# Patient Record
Sex: Female | Born: 1965 | Race: White | Hispanic: No | Marital: Married | Smoking: Current every day smoker
Health system: Southern US, Community
[De-identification: ages and names within clinical notes are randomized; demographics above are authoritative.]

## PROBLEM LIST (undated history)

## (undated) DIAGNOSIS — C801 Malignant (primary) neoplasm, unspecified: Secondary | ICD-10-CM

## (undated) DIAGNOSIS — F419 Anxiety disorder, unspecified: Secondary | ICD-10-CM

## (undated) DIAGNOSIS — E785 Hyperlipidemia, unspecified: Secondary | ICD-10-CM

## (undated) DIAGNOSIS — F329 Major depressive disorder, single episode, unspecified: Secondary | ICD-10-CM

## (undated) DIAGNOSIS — M1990 Unspecified osteoarthritis, unspecified site: Secondary | ICD-10-CM

## (undated) DIAGNOSIS — F32A Depression, unspecified: Secondary | ICD-10-CM

## (undated) DIAGNOSIS — I319 Disease of pericardium, unspecified: Secondary | ICD-10-CM

## (undated) DIAGNOSIS — B1920 Unspecified viral hepatitis C without hepatic coma: Secondary | ICD-10-CM

## (undated) HISTORY — PX: TUBAL LIGATION: SHX77

## (undated) HISTORY — PX: PANCREATECTOMY: SHX1019

## (undated) HISTORY — DX: Malignant (primary) neoplasm, unspecified: C80.1

## (undated) HISTORY — PX: TONSILLECTOMY: SUR1361

## (undated) HISTORY — PX: HEMORRHOID SURGERY: SHX153

## (undated) HISTORY — DX: Hyperlipidemia, unspecified: E78.5

## (undated) HISTORY — PX: SPLENECTOMY, TOTAL: SHX788

## (undated) HISTORY — PX: KNEE ARTHROSCOPY: SUR90

## (undated) HISTORY — DX: Unspecified viral hepatitis C without hepatic coma: B19.20

## (undated) HISTORY — PX: EYE SURGERY: SHX253

---

## 2000-07-17 ENCOUNTER — Encounter (INDEPENDENT_AMBULATORY_CARE_PROVIDER_SITE_OTHER): Payer: Self-pay | Admitting: *Deleted

## 2000-07-17 ENCOUNTER — Encounter: Payer: Self-pay | Admitting: Gastroenterology

## 2000-07-17 ENCOUNTER — Ambulatory Visit (HOSPITAL_COMMUNITY): Admission: RE | Admit: 2000-07-17 | Discharge: 2000-07-17 | Payer: Self-pay | Admitting: Gastroenterology

## 2002-06-23 ENCOUNTER — Ambulatory Visit (HOSPITAL_COMMUNITY): Admission: RE | Admit: 2002-06-23 | Discharge: 2002-06-23 | Payer: Self-pay | Admitting: General Surgery

## 2002-06-30 ENCOUNTER — Ambulatory Visit (HOSPITAL_COMMUNITY): Admission: RE | Admit: 2002-06-30 | Discharge: 2002-06-30 | Payer: Self-pay

## 2003-12-24 DIAGNOSIS — C801 Malignant (primary) neoplasm, unspecified: Secondary | ICD-10-CM

## 2003-12-24 HISTORY — DX: Malignant (primary) neoplasm, unspecified: C80.1

## 2004-03-27 ENCOUNTER — Other Ambulatory Visit: Admission: RE | Admit: 2004-03-27 | Discharge: 2004-03-27 | Payer: Self-pay | Admitting: Family Medicine

## 2004-04-03 ENCOUNTER — Ambulatory Visit (HOSPITAL_COMMUNITY): Admission: RE | Admit: 2004-04-03 | Discharge: 2004-04-03 | Payer: Self-pay | Admitting: Family Medicine

## 2004-04-09 ENCOUNTER — Ambulatory Visit (HOSPITAL_COMMUNITY): Admission: RE | Admit: 2004-04-09 | Discharge: 2004-04-09 | Payer: Self-pay | Admitting: Family Medicine

## 2004-04-20 ENCOUNTER — Encounter: Admission: RE | Admit: 2004-04-20 | Discharge: 2004-04-20 | Payer: Self-pay | Admitting: Vascular Surgery

## 2004-05-29 ENCOUNTER — Encounter (INDEPENDENT_AMBULATORY_CARE_PROVIDER_SITE_OTHER): Payer: Self-pay | Admitting: Specialist

## 2004-05-29 ENCOUNTER — Inpatient Hospital Stay (HOSPITAL_COMMUNITY): Admission: RE | Admit: 2004-05-29 | Discharge: 2004-06-05 | Payer: Self-pay | Admitting: Surgery

## 2004-07-09 ENCOUNTER — Encounter (HOSPITAL_COMMUNITY): Admission: RE | Admit: 2004-07-09 | Discharge: 2004-09-18 | Payer: Self-pay | Admitting: Oncology

## 2005-01-14 ENCOUNTER — Ambulatory Visit (HOSPITAL_COMMUNITY): Admission: RE | Admit: 2005-01-14 | Discharge: 2005-01-14 | Payer: Self-pay | Admitting: Oncology

## 2005-01-22 ENCOUNTER — Ambulatory Visit: Payer: Self-pay | Admitting: Oncology

## 2005-04-29 ENCOUNTER — Ambulatory Visit: Payer: Self-pay | Admitting: Oncology

## 2005-09-24 ENCOUNTER — Ambulatory Visit: Payer: Self-pay | Admitting: Oncology

## 2005-12-24 ENCOUNTER — Encounter: Admission: RE | Admit: 2005-12-24 | Discharge: 2005-12-24 | Payer: Self-pay | Admitting: Oncology

## 2005-12-24 ENCOUNTER — Ambulatory Visit: Payer: Self-pay | Admitting: Oncology

## 2006-06-09 ENCOUNTER — Encounter: Admission: RE | Admit: 2006-06-09 | Discharge: 2006-06-09 | Payer: Self-pay | Admitting: Oncology

## 2006-06-17 ENCOUNTER — Ambulatory Visit: Payer: Self-pay | Admitting: Oncology

## 2006-06-23 LAB — CBC WITH DIFFERENTIAL/PLATELET
Basophils Absolute: 0 10*3/uL (ref 0.0–0.1)
EOS%: 1 % (ref 0.0–7.0)
HCT: 40.5 % (ref 34.8–46.6)
HGB: 13.8 g/dL (ref 11.6–15.9)
MCH: 31.4 pg (ref 26.0–34.0)
MCV: 92.3 fL (ref 81.0–101.0)
MONO%: 16.4 % — ABNORMAL HIGH (ref 0.0–13.0)
NEUT%: 48.4 % (ref 39.6–76.8)

## 2006-06-26 LAB — COMPREHENSIVE METABOLIC PANEL
AST: 58 U/L — ABNORMAL HIGH (ref 0–37)
Alkaline Phosphatase: 67 U/L (ref 39–117)
BUN: 11 mg/dL (ref 6–23)
Calcium: 9.1 mg/dL (ref 8.4–10.5)
Chloride: 107 mEq/L (ref 96–112)
Creatinine, Ser: 0.69 mg/dL (ref 0.40–1.20)
Glucose, Bld: 100 mg/dL — ABNORMAL HIGH (ref 70–99)

## 2006-06-30 LAB — URINALYSIS, MICROSCOPIC - CHCC
Bilirubin (Urine): NEGATIVE
Glucose: NEGATIVE g/dL
Ketones: NEGATIVE mg/dL
RBC count: NEGATIVE (ref 0–2)
pH: 6 (ref 4.6–8.0)

## 2006-07-02 LAB — URINE CULTURE

## 2006-08-27 ENCOUNTER — Encounter: Admission: RE | Admit: 2006-08-27 | Discharge: 2006-08-27 | Payer: Self-pay | Admitting: Obstetrics and Gynecology

## 2006-12-20 ENCOUNTER — Ambulatory Visit: Payer: Self-pay | Admitting: Oncology

## 2008-05-23 ENCOUNTER — Ambulatory Visit: Payer: Self-pay | Admitting: Oncology

## 2008-05-25 LAB — CBC WITH DIFFERENTIAL/PLATELET
Basophils Absolute: 0.1 10*3/uL (ref 0.0–0.1)
Eosinophils Absolute: 0.2 10*3/uL (ref 0.0–0.5)
HCT: 41.3 % (ref 34.8–46.6)
HGB: 13.9 g/dL (ref 11.6–15.9)
MONO#: 1.4 10*3/uL — ABNORMAL HIGH (ref 0.1–0.9)
NEUT%: 54.1 % (ref 39.6–76.8)
WBC: 9.9 10*3/uL (ref 3.9–10.0)
lymph#: 2.9 10*3/uL (ref 0.9–3.3)

## 2008-05-27 LAB — COMPREHENSIVE METABOLIC PANEL
ALT: 61 U/L — ABNORMAL HIGH (ref 0–35)
BUN: 16 mg/dL (ref 6–23)
CO2: 26 mEq/L (ref 19–32)
Calcium: 9.8 mg/dL (ref 8.4–10.5)
Chloride: 106 mEq/L (ref 96–112)
Creatinine, Ser: 0.64 mg/dL (ref 0.40–1.20)

## 2008-05-27 LAB — CHROMOGRANIN A: Chromogranin A: 7.4 ng/mL (ref <=36.4)

## 2008-06-08 ENCOUNTER — Ambulatory Visit (HOSPITAL_COMMUNITY): Admission: RE | Admit: 2008-06-08 | Discharge: 2008-06-08 | Payer: Self-pay | Admitting: Oncology

## 2008-06-13 ENCOUNTER — Encounter: Admission: RE | Admit: 2008-06-13 | Discharge: 2008-06-13 | Payer: Self-pay | Admitting: Oncology

## 2008-09-21 ENCOUNTER — Encounter: Admission: RE | Admit: 2008-09-21 | Discharge: 2008-09-21 | Payer: Self-pay | Admitting: Obstetrics and Gynecology

## 2009-03-21 ENCOUNTER — Emergency Department (HOSPITAL_COMMUNITY): Admission: EM | Admit: 2009-03-21 | Discharge: 2009-03-21 | Payer: Self-pay | Admitting: Emergency Medicine

## 2009-03-23 ENCOUNTER — Encounter (INDEPENDENT_AMBULATORY_CARE_PROVIDER_SITE_OTHER): Payer: Self-pay | Admitting: General Surgery

## 2009-03-23 ENCOUNTER — Ambulatory Visit (HOSPITAL_COMMUNITY): Admission: RE | Admit: 2009-03-23 | Discharge: 2009-03-23 | Payer: Self-pay | Admitting: General Surgery

## 2009-05-31 ENCOUNTER — Ambulatory Visit: Payer: Self-pay | Admitting: Oncology

## 2009-05-31 LAB — CBC WITH DIFFERENTIAL/PLATELET
EOS%: 1.6 % (ref 0.0–7.0)
Eosinophils Absolute: 0.1 10*3/uL (ref 0.0–0.5)
LYMPH%: 36.4 % (ref 14.0–49.7)
MCH: 30.8 pg (ref 25.1–34.0)
MCHC: 34.2 g/dL (ref 31.5–36.0)
MCV: 90.2 fL (ref 79.5–101.0)
MONO%: 15.5 % — ABNORMAL HIGH (ref 0.0–14.0)
NEUT#: 3.9 10*3/uL (ref 1.5–6.5)
Platelets: 419 10*3/uL — ABNORMAL HIGH (ref 145–400)
RBC: 3.57 10*6/uL — ABNORMAL LOW (ref 3.70–5.45)
nRBC: 0 % (ref 0–0)

## 2009-05-31 LAB — COMPREHENSIVE METABOLIC PANEL
ALT: 43 U/L — ABNORMAL HIGH (ref 0–35)
CO2: 30 mEq/L (ref 19–32)
Creatinine, Ser: 0.69 mg/dL (ref 0.40–1.20)
Glucose, Bld: 87 mg/dL (ref 70–99)
Total Bilirubin: 0.4 mg/dL (ref 0.3–1.2)

## 2009-05-31 LAB — TECHNOLOGIST REVIEW

## 2009-06-01 ENCOUNTER — Ambulatory Visit (HOSPITAL_COMMUNITY): Admission: RE | Admit: 2009-06-01 | Discharge: 2009-06-01 | Payer: Self-pay | Admitting: Oncology

## 2009-06-04 LAB — CHROMOGRANIN A: Chromogranin A: 7.3 ng/mL (ref <=36.4)

## 2009-07-03 ENCOUNTER — Ambulatory Visit: Payer: Self-pay | Admitting: Oncology

## 2011-01-12 ENCOUNTER — Encounter: Payer: Self-pay | Admitting: Oncology

## 2011-04-03 LAB — URINALYSIS, ROUTINE W REFLEX MICROSCOPIC
Glucose, UA: NEGATIVE mg/dL
Hgb urine dipstick: NEGATIVE
Ketones, ur: 15 mg/dL — AB
Protein, ur: NEGATIVE mg/dL
pH: 6.5 (ref 5.0–8.0)

## 2011-04-03 LAB — COMPREHENSIVE METABOLIC PANEL
ALT: 41 U/L — ABNORMAL HIGH (ref 0–35)
AST: 43 U/L — ABNORMAL HIGH (ref 0–37)
Albumin: 3.8 g/dL (ref 3.5–5.2)
Alkaline Phosphatase: 65 U/L (ref 39–117)
Calcium: 9.1 mg/dL (ref 8.4–10.5)
GFR calc Af Amer: >60 mL/min (ref >60)
Potassium: 3.6 mEq/L (ref 3.5–5.1)
Sodium: 138 mEq/L (ref 135–145)
Total Protein: 6.6 g/dL (ref 6.0–8.3)

## 2011-04-03 LAB — URINE MICROSCOPIC-ADD ON

## 2011-04-03 LAB — DIFFERENTIAL
Eosinophils Absolute: 0.1 10*3/uL (ref 0.0–0.7)
Eosinophils Relative: 1 % (ref 0–5)
Lymphs Abs: 1.8 10*3/uL (ref 0.7–4.0)
Monocytes Absolute: 1.7 10*3/uL — ABNORMAL HIGH (ref 0.1–1.0)
Monocytes Relative: 15 % — ABNORMAL HIGH (ref 3–12)

## 2011-04-03 LAB — CBC
MCHC: 33.7 g/dL (ref 30.0–36.0)
RBC: 4.75 MIL/uL (ref 3.87–5.11)
RDW: 14.4 % (ref 11.5–15.5)

## 2011-05-07 NOTE — Op Note (Signed)
Kristine Madden, Kristine Madden              ACCOUNT NO.:  [id number]   MEDICAL RECORD NO.:  [id number]          PATIENT TYPE:  AMB   LOCATION:  DAY                          FACILITY:  [facility]   PHYSICIAN:  Sharlet Salina T. Hoxworth, M.D.DATE OF BIRTH:  09-14-1966   DATE OF PROCEDURE:  03/23/2009  DATE OF DISCHARGE:                               OPERATIVE REPORT   PREOPERATIVE DIAGNOSIS:  Severe rectal pain.   POSTOPERATIVE DIAGNOSIS:  Severe combination hemorrhoids with  thrombosis.   SURGICAL PROCEDURE:  Exam under anesthesia, extensive hemorrhoidectomy,  lateral internal anal sphincterotomy.   SURGEON:  Lorne Skeens. Hoxworth, M.D.   ANESTHESIA:  General.   BRIEF HISTORY:  Kristine Madden is a 45 year old female with a previous  history of hemorrhoidectomy by Dr. Carolynne Edouard in 2003 for internal and  external hemorrhoids with swelling and irritation.  She states she had  continued to have some problems over the years with intermittent  swelling and prolapse and mild pain.  However, in recent days,  particularly the past 2-3 days she has had persistent severe rectal pain  which required a trip to the emergency room.  She was treated with  topical cream.  I saw her in the office yesterday.  She was continuing  to complain of very severe pain.  Exam in the office was difficult.  She  did appear to have some hemorrhoids but due to pain and spasm exam in  the office was difficult.  She also appeared to have some spasm of the  internal anal sphincter.  I recommended exam under anesthesia and then  proceeding based on the findings with possible sphincterotomy and/or  hemorrhoidectomy.  The indications for the procedure, possible  procedures to be performed, risks of bleeding, infection, degrees of  incontinence were discussed and understood.  She is now brought to the  operating room for this procedure.   DESCRIPTION OF OPERATION:  The patient was brought to the operating room  and placed in supine  position on the operating table and general  endotracheal anesthesia was induced.  She was carefully placed in the  lithotomy position.  The patient had told me in the holding area that  she had had significant tissue prolapse overnight.  Indeed on external  inspection there were significantly prolapsed combination hemorrhoids  with thrombosis.  The perineum was widely sterilely prepped and draped.  She received preoperative IV antibiotics.  Correct patient and procedure  were verified.  The anus was gently dilated and rectal retractor placed.  There were significantly thrombosed irritated combination hemorrhoids in  the right posterior lateral and left posterior lateral position.  The  internal anal sphincter did feel taut and hypertrophied but I did not  see any definite fissure.  There was no evidence of abscess, infection  or fistula or mucosal inflammation.  I elected to proceed with formal  hemorrhoidectomy.  The larger right posterior lateral group was  addressed initially.  This was elevated with a Pennington clamp and a 3-  0 chromic suture was placed at the apex of the hemorrhoidal group.  An  elliptical excision was then performed  extending out onto the anoderm.  The hemorrhoidal group was then dissected up off the anal sphincters  carefully protecting them.  There was extensive thrombosis within the  hemorrhoid.  This large hemorrhoid group was completely excised.  Hemostasis was obtained with cautery and with closure of the defect with  the chromic suture in a running locking fashion.  Following this the  somewhat smaller but significant area in the left lateral position was  identically excised and closed.  The patient did seem to have  significant hypertrophy of the internal anal sphincter and I thought  that pain and healing would be improved with sphincterotomy.  I  identified the intersphincteric groove in the right lateral position.  A  small stab incision was made and  the hemostat inserted into the  intersphincteric groove and the internal sphincter hypertrophied portion  elevated into the small incision.  This was then divided with cautery  and hemostasis was obtained with pressure.  Following this an extensive  rectal block was performed with Marcaine.  A Gelfoam pack was placed.  Hemostasis appeared complete.  Sponge, needle and instrument counts were  correct.  The patient was taken to recovery in good condition.      Lorne Skeens. Hoxworth, M.D.  Electronically Signed     BTH/MEDQ  D:  03/23/2009  T:  03/23/2009  Job:  [id number]

## 2011-05-10 NOTE — Discharge Summary (Signed)
NAME:  Kristine Madden, Kristine Madden                        ACCOUNT NO.:  [id number]   MEDICAL RECORD NO.:  [id number]                   PATIENT TYPE:  INP   LOCATION:  0480                                 FACILITY:  [facility]   PHYSICIAN:  Velora Heckler, M.D.                DATE OF BIRTH:  14-Oct-1966   DATE OF ADMISSION:  05/29/2004  DATE OF DISCHARGE:  06/05/2004                                 DISCHARGE SUMMARY   REASON FOR ADMISSION:  Cystic neoplasm of the pancreas.   HISTORY OF PRESENT ILLNESS:  Patient is a 45 year old white female from  [place], [place].  Patient had undergone a CT scan of the abdomen and  pelvis for abdominal pain.  She was noted to have a 3.7 cm cystic neoplasm  of the tail of the pancreas.  Further workup including a MRI and CT  angiography.  She was referred to our practice for consideration of  resection.   HOSPITAL COURSE:  The patient was admitted on May 29, 2004.  She underwent  distal pancreatectomy and splenectomy for resection of a neoplasm of the  tail of the pancreas.  Postoperative course was relatively straightforward.  Patient remained hemodynamically stable.  She had a postoperative ileus,  which persisted for four days.  Nasogastric tube was removed on the fourth  postoperative day.  She was started on clear liquids on the fourth  postoperative day.  She was advanced in her diet.  She was switched to oral  pain medication.  Drainage from her Jackson-Pratt drain decreased  significantly over the 6th and 7th postoperative days, and it was removed  prior to discharge.   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  Surgical margins were  negative.  There was no evidence of spread to the lymph nodes or adjacent  viscera.   DISCHARGE PLAN:  Patient was discharged home on June 05, 2004.  Discharge  medications included Tylox as needed for pain.  Patient will be seen back  at  my office at [facility] in 10-14 days.   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                                               Velora Heckler, M.D.    TMG/MEDQ  D:  06/15/2004  T:  06/15/2004  Job:  [id number]   cc:   Janetta Hora. Fields, MD  [address], [place] [address]   Elana Alm. Nicholos Johns, M.D.  [address]  [place]  [place] [address]  Fax: [phone]   Tasia Catchings, M.D.  [address]  [address]  [place]  [place] [address]  Fax: [phone]

## 2011-05-10 NOTE — Op Note (Signed)
[facility]  Patient:    Kristine Madden, Kristine Madden Visit Number: [id number] MRN: [id number]          Service Type: DSU Location: DAY Attending Physician:  Barbaraann Cao Dictated by:   Ronda Fairly. Galen Daft, M.D. Proc. Date: 06/30/02 Admit Date:  06/30/2002 Discharge Date: 06/30/2002   CC:         Molly Maduro A. Eliezer Lofts., M.D.   Operative Report  PREOPERATIVE DIAGNOSES:  Elective sterilization, desires sterilization and multiparity.  POSTOPERATIVE DIAGNOSES:  Elective sterilization, desires sterilization and multiparity.  PROCEDURE:  Laparoscopic tubal ligation.  SURGEON:  Ronda Fairly. Galen Daft, M.D.  ANESTHESIA:  General.  COMPLICATIONS:  None.  ESTIMATED BLOOD LOSS:  Less than 10 cc.  DESCRIPTION OF PROCEDURE:  The patient was identified as Kristine Madden.  We obtained informed consent prior to bringing her to the operating room. After this, Betadine prep sterile technique was utilized. An umbilical incision was utilized for the Veress needle. The abdomen was inflated after care was taken to ascertain correct placement. The carbon dioxide was the medium of choice. The trocar was then placed in the umbilical site. The uterus was normal in appearance as were her left and right ovary. The tubes were normal identified down to their fimbriated ends on both sides separate and distinct from other structures. The left tube was grasped with the Klepinger bipolar cautery and it was cauterized in four locations contiguously down to zero amplitude. The right tube was done in the same fashion. Again the tubes were identified down to their fimbriated ends. There were no adjacent structures harmed in this fashion. The procedure was then completed and the instruments were removed and the gas was removed from the abdomen. The fascia was reinforced with the cutaneous suture which was monocryl. The cutaneous suture was also utilized for the subcuticular closure with two  interrupted sutures and there was no active bleeding. The incision was infiltrated with 5 cc of 0.5 strength of Marcaine and the instrument, sponge and needle counts were correct throughout the case. Dictated by:   Ronda Fairly. Galen Daft, M.D. Attending Physician:  Barbaraann Cao DD:  06/30/02 TD:  07/04/02 Job: [id number]

## 2011-05-10 NOTE — Op Note (Signed)
NAME:  Kristine Madden, Kristine Madden                        ACCOUNT NO.:  [id number]   MEDICAL RECORD NO.:  [id number]                   PATIENT TYPE:  INP   LOCATION:  Z610                                 FACILITY:  [facility]   PHYSICIAN:  Velora Heckler, M.D.                DATE OF BIRTH:  [birth year]   DATE OF PROCEDURE:  05/29/2004  DATE OF DISCHARGE:                                 OPERATIVE REPORT   PREOPERATIVE DIAGNOSIS:  Cystic neoplasm of the pancreas.   POSTOPERATIVE DIAGNOSIS:  Probably neuroendocrine tumor of the pancreas.   PROCEDURE:  1. Distal pancreatectomy.  2. Splenectomy.   SURGEON:  Velora Heckler, M.D.   ASSISTANT:  Jerelene Redden, M.D.   ANESTHESIA:  General per Dr. Sherrian Divers   ESTIMATED BLOOD LOSS:  150 mL.   PREPARATION:  Betadine.   COMPLICATIONS:  None.   DRAINS:  16 [place] Blake drain to pancreatic bed.   INDICATIONS:  The patient is a 45 year old white female from [place], [place].  The patient presented with abdominal pain.  She underwent CT scan  of the abdomen and pelvis in April 2005.  This showed a 3.7 cm cystic  neoplasm in the tail of the pancreas.  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  The patient was sent to Dr. Fabienne Bruns at [facility].  He  ordered a CT angiogram.  This showed a cystic neoplasm of the tail of the  pancreas and no evidence of splenic artery aneurysm.  The patient was then  referred to my practice for consideration for a resection.  Preoperative  assessment with tumor markers was negative.  The patient did receive triple  vaccinations in preparation for splenectomy.  The patient now comes to the  operating room for distal pancreatectomy and splenectomy for cystic neoplasm  of the tail of the pancreas.   DESCRIPTION OF PROCEDURE:  The procedure is done in OR #11 at  the [facility].  The patient is brought to the operating room,  placed in a supine position on the operating room table.  Following the  administration of general anesthesia, the patient is prepped and draped in  the usual strict aseptic fashion.  After ascertaining that an adequate level  of anesthesia had been obtained, a left subcostal incision is made with a  #10 blade.  Dissection is carried down through subcutaneous tissues with the  electrocautery.  Fascia is incised, then muscle wall is divided in layers,  and the peritoneal cavity is entered cautiously.  Abdomen is explored.  Bookwalter retractor is placed for exposure.  Splenic flexure of the colon  is taken down using the electrocautery.  It is gently packed away.  The  gastrocolic ligament is then divided  using the electrocautery.  Vessels are  divided between hemostats and ligated with 3-0 and 2-0 silk ties.  Colon is  then packed away under the retractor providing good exposure of the left  upper quadrant.  Next, dissection is continued along the greater curvature  of the stomach.  Short gastric vessels are divided between Kelly clamps and  ligated with 2-0 silk ties and large Ligaclips.  Dissection is carried  proximally to the superior pole of the spleen.  Spleen is then mobilized  from its lateral retroperitoneal attachments.  The spleen is completely  elevated out of the left upper quadrant retroperitoneum.  Superior pole is  mobilized.  Remaining short gastric vessels between the high fundus of the  stomach and superior pole of the spleen are divided between large Ligaclips.  The stomach is then packed medially along with the left lateral segment of  the liver.  With good exposure of the distal pancreas, there is an obvious  mass, measuring approximately 4 cm in greatest dimension in the mid tail of  the pancreas.  With gentle dissection the superior and inferior borders of  the pancreas are dissected  out.  Spleen is then rotated medially, exposing  the posterior aspect of the pancreas.  A pedicle of tail of the pancreas and  spleen is then completely elevated.  The splenic vessels are noted at the  superior edge of the pancreas.  The splenic artery is dissected out proximal  to the tumor.  It is doubly ligated proximally and distally with 2-0 silk  ties and divided.  Next, the splenic vein is dissected out, again proximal  to the tumor, doubly ligated proximally and distally, and divided.  Stay  sutures are placed at the superior and inferior edges of the normal pancreas  proximal to the tumor.  These are tied securely.  Using a TA stapler with  4.8 mm staples, the pancreas is transected proximal to the tumor.  The  specimen is submitted as distal pancreas and spleen en bloc.  Dr. Jimmy Picket performed a frozen section biopsy at the pancreatic margin.  On  close inspection, there does appear to be a small, less than 1/10th of a mm  nodule of tumor present at the surgical margin.  Therefore, the pancreas is  again inspected.  The splenic vessels are dissected down on the superior  margin of the body of the pancreas.  Stay sutures are again placed on the  superior and inferior edges of the body of the pancreas and tied securely.  A second reload of the TA 60 stapler with 4.8 mm staples is again placed  across the body of the pancreas so as to obtain approximately 10-15 mm more  pancreatic tissue.  Stapler is fired, and the distal pancreas is excised  sharply with a #10 blade.  Specimen is marked with a suture at the new  pancreatic margin.  It is submitted to pathology where Dr. Luisa Hart again  performed a frozen section.  He feels there is a generous pancreatic margin  around the tumor now present.  He also feels that the tumor likely  represents a neuroendocrine tumor of the pancreas.   Good hemostasis is noted after removal of the stapler.  A 2-0 silk figure-of- eight suture ligature  is placed near the splenic vessels at the superior  edge of the pancreatic parenchyma.  Good hemostasis is noted.  Surgicel is  placed over the pancreatic stump and along the staple line.  A 19 [place]  Blake drain is brought in from a left lateral stab wound through the  abdominal wall, and the drain is placed along the pancreatic bed near the  cut surface of the pancreas.  Abdomen is irrigated copiously with warm  saline which is evacuated.  Very good hemostasis is noted.  Stomach is laid  over the pancreas and drained.  Colon is released.  Liver is released.  Good  hemostasis is noted throughout.  Abdominal wall is then closed in two layers  with running #1 PDS sutures.  The skin is closed with stainless steel  staples.  Drain is secured to the skin with a 3-0 nylon suture and placed to  bulb suction.  Wounds are washed and dried, and sterile dressings are  applied.  The patient is awakened from anesthesia and brought to the  recovery room in stable condition.  The patient tolerated the procedure  well.                                               Velora Heckler, M.D.    TMG/MEDQ  D:  05/29/2004  T:  05/29/2004  Job:  [id number]   cc:   Janetta Hora. Fields, MD  [address], [place] [address]   Elana Alm. Nicholos Johns, M.D.  [address]  [place]  [place] [address]  Fax: [phone]   Tasia Catchings, M.D.  [address]  [place]  [place] [address]  Fax: [phone]

## 2011-05-10 NOTE — Op Note (Signed)
[facility]  Patient:    Kristine Madden, Kristine Madden Visit Number: [id number] MRN: [id number]          Service Type: DSU Location: DAY Attending Physician:  Barbaraann Cao Dictated by:   Ollen Gross. Vernell Morgans, M.D. Proc. Date: 06/23/02 Admit Date:  06/30/2002 Discharge Date: 06/30/2002                             Operative Report  PREOPERATIVE DIAGNOSIS:  Internal and external hemorrhoids.  POSTOPERATIVE DIAGNOSIS:  Internal and external hemorrhoids.  PROCEDURE:  Hemorrhoidectomy.  SURGEON:  Ollen Gross. Vernell Morgans, M.D.  ANESTHESIA:  General via LMA.  PROCEDURE:  After informed consent was obtained, the patient was brought to the operating room, placed in a supine position on the operating table. After adequate induction of general anesthesia, the patient was placed in lithotomy position and her rectal region was prepped with Betadine and draped in usual sterile manner. The hemorrhoid in question was infiltrated with a mixture of 9 cc of lidocaine and 1 cc of Wydase and this area was gently massaged for several minutes. The internal and external portions of the hemorrhoid were then grasped with Allis clamps. A figure-of-eight stitch was placed at the apex of the hemorrhoidal tissue interiorly. The hemorrhoidal tissue was then scored with a 15 blade knife. Sharp dissection was carried out externally to remove the hemorrhoid tissue from the external sphincter. The external and internal sphincters and the vascular supply to the hemorrhoid was then clamped with a hemostat and the rest of the hemorrhoid was removed sharply using a 2-0 chromic stitch which had previously been placed at the apex of the hemorrhoidal tissue. The stitch was then run exteriorly underneath the hemostat. The hemostat was then removed and the stitch was tightened down. The stitch was run exteriorly until there was about 3 or 4 mm of incision still open and at this point, the stitch was tied  down. A bullet retractor was able to be inserted into the anal canal easily. The incision was then inspected and appeared to be hemostatic and in good opposition. Lidocaine jelly was then placed in the anal canal and a piece of Gelfoam was placed over the incision. Sterile dressings were then applied. The patient tolerated the procedure well. At the end of the case, all sponge, instrument, and needle counts were correct. The patient was awakened, taken to the recovery room in stable condition. Dictated by:   Ollen Gross. Vernell Morgans, M.D. Attending Physician:  Barbaraann Cao DD:  07/14/02 TD:  07/19/02 Job: [id number]

## 2011-08-06 ENCOUNTER — Other Ambulatory Visit: Payer: Self-pay | Admitting: Obstetrics and Gynecology

## 2011-08-06 DIAGNOSIS — Z1231 Encounter for screening mammogram for malignant neoplasm of breast: Secondary | ICD-10-CM

## 2011-08-13 ENCOUNTER — Ambulatory Visit
Admission: RE | Admit: 2011-08-13 | Discharge: 2011-08-13 | Disposition: A | Discharge status: Home / Self Care | Payer: BC Managed Care – PPO | Source: Ambulatory Visit | Attending: Obstetrics and Gynecology | Admitting: Obstetrics and Gynecology

## 2011-08-13 DIAGNOSIS — Z1231 Encounter for screening mammogram for malignant neoplasm of breast: Secondary | ICD-10-CM

## 2013-08-17 ENCOUNTER — Ambulatory Visit (INDEPENDENT_AMBULATORY_CARE_PROVIDER_SITE_OTHER): Payer: Self-pay | Admitting: Surgery

## 2013-08-17 ENCOUNTER — Encounter (INDEPENDENT_AMBULATORY_CARE_PROVIDER_SITE_OTHER): Payer: Self-pay | Admitting: Surgery

## 2013-08-17 VITALS — BP 122/78 | HR 64 | Resp 16 | Ht 67.0 in | Wt 131.0 lb

## 2013-08-17 DIAGNOSIS — L0291 Cutaneous abscess, unspecified: Secondary | ICD-10-CM

## 2013-08-17 NOTE — Patient Instructions (Signed)
Remove packing in 48 hours.  Ok to shower in 24 hours. Finish antibiotics.  Return next week for recheck.

## 2013-08-17 NOTE — Addendum Note (Signed)
Addended by: Brennan Bailey on: 08/17/2013 04:15 PM   Modules accepted: Orders

## 2013-08-17 NOTE — Progress Notes (Signed)
Patient ID: Kristine Madden, female   DOB: May 18, 1966, 47 y.o.   MRN: [id number]  Chief Complaint  Patient presents with  . New Evaluation    urgent/absc on abdomen    HPI Kristine Madden is a 47 y.o. female.  Patient sent at request by Dr. Erling Conte do to skin abscess overlying the right pubis. Been present for last 4 days. Getting more red and tender. She was placed on doxycycline yesterday he received 1 g of Rocephin. She does shave this area. HPI  Past Medical History  Diagnosis Date  . Cancer     Past Surgical History  Procedure Laterality Date  . Hemorrhoid surgery      History reviewed. No pertinent family history.  Social History History  Substance Use Topics  . Smoking status: Current Every Day Smoker -- 1.00 packs/day    Types: Cigarettes  . Smokeless tobacco: Never Used  . Alcohol Use: Yes    Allergies  Allergen Reactions  . Penicillins Hives    Current Outpatient Prescriptions  Medication Sig Dispense Refill  . doxycycline (ORACEA) 40 MG capsule Take 40 mg by mouth every morning.      Marland Kitchen ibuprofen (ADVIL,MOTRIN) 800 MG tablet Take 800 mg by mouth every 8 (eight) hours as needed for pain.       No current facility-administered medications for this visit.    Review of Systems Review of Systems  Constitutional: Positive for fever, chills and fatigue.  Respiratory: Negative.   Cardiovascular: Negative.   Neurological: Negative.   Hematological: Negative.     Blood pressure 122/78, pulse 64, resp. rate 16, height 5\' 7"  (1.702 m), weight 131 lb (59.421 kg).  Physical Exam Physical Exam  Constitutional: She appears well-developed.  HENT:  Head: Normocephalic and atraumatic.  Skin:     Psychiatric: Her behavior is normal.    [facility]  Assessment    Right groin abscess    Plan    Recommend incision and drainage under local anesthesia in the office. Risks, benefits and alternative therapies discussed with the patient  husband. Risk of bleeding, infection, need for other operations. They wish to proceed. Under sterile conditions the skin overlying the right groin region over the right pubis was prepped in a sterile fashion. 1% lidocaine was used for local anesthesia. A cruciate incision was used and dark turbid fluid and a small pus was expressed. The erythema around this was noted. Packed with quarter-inch packing. Loculations broken up a Q-tip. Dry dressing applied. Remove packing in 24 hours and shower. Finish doxycycline. Return to office 1 week. Cover with clean dry dressing after packing is out. Call with high fever, nausea, vomiting or worsening pain.       Cornell Bourbon A. 08/17/2013, 3:58 PM

## 2013-08-19 ENCOUNTER — Other Ambulatory Visit (INDEPENDENT_AMBULATORY_CARE_PROVIDER_SITE_OTHER): Payer: Self-pay | Admitting: *Deleted

## 2013-08-19 ENCOUNTER — Telehealth (INDEPENDENT_AMBULATORY_CARE_PROVIDER_SITE_OTHER): Payer: Self-pay | Admitting: General Surgery

## 2013-08-19 MED ORDER — HYDROCODONE-ACETAMINOPHEN 5-325 MG PO TABS: 1.0000 | ORAL_TABLET | Freq: Four times a day (QID) | ORAL | Status: DC | PRN

## 2013-08-19 NOTE — Telephone Encounter (Signed)
Called in Norco 5/325mg  take 1 tablet every 6 hours as needed for pain #30 no refills; [phone]

## 2013-08-19 NOTE — Telephone Encounter (Signed)
yes

## 2013-08-19 NOTE — Telephone Encounter (Signed)
Husband called along with patient to state that when patient was here in the office she declined the narcotic pain medication however as they have moved further into the healing process and having to remove the packing patient now feels she needs a narcotic.  They are asking if Norco can be called into the [facility] in [place] to try to help the patient.  Explained that I need to get approval from Dr. Luisa Hart then will let them know.  He states understanding at this time and agreeable with plan.

## 2013-08-19 NOTE — Telephone Encounter (Signed)
Pt called to ask about removing the wound packing.  Suggested she get in the shower to moisten the packing material, since she stated is feels hard.  After the packing is wet, gently remove it completely; wash with soap and water, rinse thoroughly in shower, and pat dry.  OK to cover with DSD to prevent weeping on clothing.

## 2013-08-20 LAB — WOUND CULTURE

## 2013-08-25 ENCOUNTER — Encounter (INDEPENDENT_AMBULATORY_CARE_PROVIDER_SITE_OTHER): Payer: Self-pay | Admitting: Surgery

## 2013-08-25 ENCOUNTER — Ambulatory Visit (INDEPENDENT_AMBULATORY_CARE_PROVIDER_SITE_OTHER): Payer: Self-pay | Admitting: Surgery

## 2013-08-25 VITALS — BP 120/76 | HR 82 | Resp 16 | Ht 67.0 in | Wt 133.6 lb

## 2013-08-25 DIAGNOSIS — L0291 Cutaneous abscess, unspecified: Secondary | ICD-10-CM

## 2013-08-25 NOTE — Patient Instructions (Signed)
Return as needed

## 2013-08-25 NOTE — Progress Notes (Signed)
Patient returns after incision and drainage of abscess to right lower abdomen. Cultures verified this to be MRSA. He is sensitive to doxycycline. She is currently on it. She feels much better. It is much less tender. The redness is much less.  Exam: Wound to right lower abdomen just above right pubic bone is open and much less erythematous than last week. It is much less tender and the induration is minimal. It is not currently draining.  Impression: Status post incision and drainage of abscess right lower abdomen just above the pubic bone culture verified as MRSA on doxycycline  Plan: Finish antibiotics. Return if she develops more swelling, pain, or increased redness.

## 2016-07-04 ENCOUNTER — Emergency Department (HOSPITAL_COMMUNITY): Payer: BLUE CROSS/BLUE SHIELD

## 2016-07-04 ENCOUNTER — Emergency Department (HOSPITAL_COMMUNITY)
Admission: EM | Admit: 2016-07-04 | Discharge: 2016-07-04 | Disposition: A | Discharge status: Home / Self Care | Payer: BLUE CROSS/BLUE SHIELD | Attending: Emergency Medicine | Admitting: Emergency Medicine

## 2016-07-04 ENCOUNTER — Encounter (HOSPITAL_COMMUNITY): Payer: Self-pay | Admitting: Emergency Medicine

## 2016-07-04 DIAGNOSIS — R0781 Pleurodynia: Secondary | ICD-10-CM

## 2016-07-04 DIAGNOSIS — I319 Disease of pericardium, unspecified: Secondary | ICD-10-CM

## 2016-07-04 DIAGNOSIS — Z859 Personal history of malignant neoplasm, unspecified: Secondary | ICD-10-CM | POA: Diagnosis not present

## 2016-07-04 DIAGNOSIS — F17210 Nicotine dependence, cigarettes, uncomplicated: Secondary | ICD-10-CM | POA: Diagnosis not present

## 2016-07-04 DIAGNOSIS — R079 Chest pain, unspecified: Secondary | ICD-10-CM | POA: Diagnosis present

## 2016-07-04 DIAGNOSIS — R102 Pelvic and perineal pain: Secondary | ICD-10-CM | POA: Diagnosis not present

## 2016-07-04 LAB — CBC
HCT: 45.6 % (ref 36.0–46.0)
Hemoglobin: 15.5 g/dL — ABNORMAL HIGH (ref 12.0–15.0)
MCH: 32.3 pg (ref 26.0–34.0)
MCHC: 34 g/dL (ref 30.0–36.0)
MCV: 95 fL (ref 78.0–100.0)
PLATELETS: 304 10*3/uL (ref 150–400)
RBC: 4.8 MIL/uL (ref 3.87–5.11)
RDW: 13.8 % (ref 11.5–15.5)
WBC: 11.3 10*3/uL — ABNORMAL HIGH (ref 4.0–10.5)

## 2016-07-04 LAB — BASIC METABOLIC PANEL
Anion gap: 8 (ref 5–15)
BUN: 7 mg/dL (ref 6–20)
CALCIUM: 9.2 mg/dL (ref 8.9–10.3)
CHLORIDE: 103 mmol/L (ref 101–111)
CO2: 27 mmol/L (ref 22–32)
CREATININE: 0.58 mg/dL (ref 0.44–1.00)
GFR calc non Af Amer: >60 mL/min (ref >60)
GLUCOSE: 104 mg/dL — AB (ref 65–99)
Potassium: 3.6 mmol/L (ref 3.5–5.1)
Sodium: 138 mmol/L (ref 135–145)

## 2016-07-04 LAB — HCG, QUANTITATIVE, PREGNANCY: hCG, Beta Chain, Quant, S: 1 m[IU]/mL (ref <5)

## 2016-07-04 LAB — I-STAT TROPONIN, ED
TROPONIN I, POC: 0 ng/mL (ref 0.00–0.08)
TROPONIN I, POC: 0.01 ng/mL (ref 0.00–0.08)

## 2016-07-04 MED ORDER — PREDNISONE 10 MG PO TABS: 60.0000 mg | ORAL_TABLET | Freq: Every day | ORAL | Status: DC

## 2016-07-04 MED ORDER — AZITHROMYCIN 250 MG PO TABS: 250.0000 mg | ORAL_TABLET | Freq: Every day | ORAL | Status: DC

## 2016-07-04 MED ORDER — NITROGLYCERIN 0.4 MG SL SUBL
0.4000 mg | SUBLINGUAL_TABLET | SUBLINGUAL | Status: DC | PRN
  Administered 2016-07-04 (×3): 0.4 mg via SUBLINGUAL
  Filled 2016-07-04: qty 1

## 2016-07-04 MED ORDER — IBUPROFEN 600 MG PO TABS: 600.0000 mg | ORAL_TABLET | Freq: Four times a day (QID) | ORAL | Status: DC | PRN

## 2016-07-04 MED ORDER — IOPAMIDOL (ISOVUE-370) INJECTION 76%
100.0000 mL | Freq: Once | INTRAVENOUS | Status: AC | PRN
  Administered 2016-07-04: 77 mL via INTRAVENOUS

## 2016-07-04 MED ORDER — ASPIRIN 81 MG PO CHEW
324.0000 mg | CHEWABLE_TABLET | Freq: Once | ORAL | Status: AC
  Administered 2016-07-04: 324 mg via ORAL
  Filled 2016-07-04: qty 4

## 2016-07-04 NOTE — Discharge Instructions (Signed)
We saw you in the ER for the chest pain/shortness of breath. All of our cardiac workup is normal, including labs, EKG and chest X-RAY are normal. We are not sure what is causing your discomfort, but we feel comfortable sending you home at this time. The workup in the ER is not complete, and you should follow up with your primary care doctor or cardiologist for further evaluation.  Please return to the ER if you have worsening chest pain, shortness of breath, pain radiating to your jaw, shoulder, or back, sweats or fainting. Otherwise see the Cardiologist or your primary care doctor as requested.   Pericarditis Pericarditis is swelling (inflammation) of the pericardium. The pericardium is a thin, double-layered, fluid-filled tissue sac that surrounds the heart. The purpose of the pericardium is to contain the heart in the chest cavity and keep the heart from overexpanding. Different types of pericarditis can occur, such as:  Acute pericarditis. Inflammation can develop suddenly in acute pericarditis.  Chronic pericarditis. Inflammation develops gradually and is long-lasting in chronic pericarditis.  Constrictive pericarditis. In this type of pericarditis, the layers of the pericardium stiffen and develop scar tissue. The scar tissue thickens and sticks together. This makes it difficult for the heart to pump and work as it normally does. CAUSES  Pericarditis can be caused from different conditions, such as:  A bacterial, fungal or viral infection.  After a heart attack (myocardial infarction).  After open-heart surgery (coronary bypass graft surgery).  Auto-immune conditions such as lupus, rheumatoid arthritis or scleroderma.  Kidney failure.  Low thyroid condition (hypothyroidism).  Cancer from another part of the body that has spread (metastasized) to the pericardium.  Chest injury or trauma.  After radiation treatment.  Certain medicines. SYMPTOMS  Symptoms of pericarditis can  include:  Chest pain. Chest pain symptoms may increase when laying down and may be relieved when sitting up and leaning forward.  A chronic, dry cough.  Heart palpitations. These may feel like rapid, fluttering or pounding heart beats.  Chest pain may be worse when swallowing.  Dizziness or fainting.  Tiredness, fatigue or lethargy.  Fever. DIAGNOSIS  Pericarditis is diagnosed by the following:  A physical exam. A heart sound called a pericardial friction rub may be heard when your caregiver listens to your heart.  Blood work. Blood may be drawn to check for an infection and to look at your blood chemistry.  Electrocardiography. During electrocardiography your heart's electrical activity is monitored and recorded with a tracing on paper (electrocardiogram [ECG]).  Echocardiography.  Computed tomography (CT).  Magnetic resonance image (MRI). TREATMENT  To treat pericarditis, it is important to know the cause of it. The cause of pericarditis determines the treatment.   If the cause of pericarditis is due to an infection, treatment is based on the type of infection. If an infection is suspected in the pericardial fluid, a procedure called a pericardial fluid culture and biopsy may be done. This takes a sample of the pericardial fluid. The sample is sent to a lab which runs tests on the pericardial fluid to check for an infection.  If the autoimmune disease is the cause, treatment of the autoimmune condition will help improve the pericarditis.  If the cause of pericarditis is not known, anti-inflammatory medicines may be used to help decrease the inflammation.  Surgery may be needed. The following are types of surgeries or procedures that may be done to treat pericarditis:  Pericardial window. A pericardial window makes a cut (incision) into the  pericardial sac. This allows excess fluid in the pericardium to drain.  Pericardiocentesis. A pericardiocentesis is also known as a  pericardial tap. This procedure uses a needle that is guided by X-ray to drain (aspirate) excess fluid from the pericardium.  Pericardiectomy. A pericardiectomy removes part or all of the pericardium. HOME CARE INSTRUCTIONS   Do not smoke. If you smoke, quit. Your caregiver can help you quit smoking.  Maintain a healthy weight.  Follow an exercise program as directed by your health care provider. You may need to limit your exercising until your symptoms go away.  If you drink alcohol, do so in moderation.  Eat a heart healthy diet. A registered dietitian can help you learn about healthy food choices.  Keep a list of all your medicines with you at all times. Include the name, dose, how often it is taken and how it is taken. SEEK IMMEDIATE MEDICAL CARE IF:   You have chest pain or feelings of chest pressure.  You have sweating (diaphoresis) when at rest.  You have irregular heartbeats (palpitations).  You have rapid, racing heart beats.  You have unexplained fainting episodes.  You feel sick to your stomach (nausea) or vomiting without cause.  You have unexplained weakness. If you develop any of the symptoms which originally made you seek care, call for local emergency medical help. Do not drive yourself to the hospital.   This information is not intended to replace advice given to you by your health care provider. Make sure you discuss any questions you have with your health care provider.   Document Released: 06/04/2001 Document Revised: 04/25/2015 Document Reviewed: 06/21/2015 Elsevier Interactive Patient Education 2016 Else[employer]  Nonspecific Chest Pain It is often hard to find the cause of chest pain. There is always a chance that your pain could be related to something serious, such as a heart attack or a blood clot in your lungs. Chest pain can also be caused by conditions that are not life-threatening. If you have chest pain, it is very important to follow up with  your doctor.  HOME CARE  If you were prescribed an antibiotic medicine, finish it all even if you start to feel better.  Avoid any activities that cause chest pain.  Do not use any tobacco products, including cigarettes, chewing tobacco, or electronic cigarettes. If you need help quitting, ask your doctor.  Do not drink alcohol.  Take medicines only as told by your doctor.  Keep all follow-up visits as told by your doctor. This is important. This includes any further testing if your chest pain does not go away.  Your doctor may tell you to keep your head raised (elevated) while you sleep.  Make lifestyle changes as told by your doctor. These may include:  Getting regular exercise. Ask your doctor to suggest some activities that are safe for you.  Eating a heart-healthy diet. Your doctor or a diet specialist (dietitian) can help you to learn healthy eating options.  Maintaining a healthy weight.  Managing diabetes, if necessary.  Reducing stress. GET HELP IF:  Your chest pain does not go away, even after treatment.  You have a rash with blisters on your chest.  You have a fever. GET HELP RIGHT AWAY IF:  Your chest pain is worse.  You have an increasing cough, or you cough up blood.  You have severe belly (abdominal) pain.  You feel extremely weak.  You pass out (faint).  You have chills.  You have sudden,  unexplained chest discomfort.  You have sudden, unexplained discomfort in your arms, back, neck, or jaw.  You have shortness of breath at any time.  You suddenly start to sweat, or your skin gets clammy.  You feel nauseous.  You vomit.  You suddenly feel light-headed or dizzy.  Your heart begins to beat quickly, or it feels like it is skipping beats. These symptoms may be an emergency. Do not wait to see if the symptoms will go away. Get medical help right away. Call your local emergency services (911 in the [place] Do not drive yourself to the  hospital.   This information is not intended to replace advice given to you by your health care provider. Make sure you discuss any questions you have with your health care provider.   Document Released: 05/27/2008 Document Revised: 12/30/2014 Document Reviewed: 07/15/2014 Elsevier Interactive Patient Education [employer].

## 2016-07-04 NOTE — ED Notes (Signed)
Patient presents for chest pain under left breast radiating to left mid back onset 0400 today. Also c/o pain with deep inhalation and nausea. Denies diaphoreses, lightheadedness, dizziness, or emesis.

## 2016-07-04 NOTE — ED Provider Notes (Addendum)
CSN: [id number]     Arrival date & time 07/04/16  1129 History   First MD Initiated Contact with Patient 07/04/16 1301     Chief Complaint  Patient presents with  . Chest Pain  . Back Pain     (Consider location/radiation/quality/duration/timing/severity/associated sxs/prior Treatment) HPI Comments: PT comes in with cc in chest pain. PT is on hormone replacement therapy and has hx of pancreatic CA, now in remission. She reports that at 4 am she started having L sided chest pain. Chest pain is constant, worse with inspiration and worse with laying flat vs. Sitting up. She has had no recent uri like symptoms and no fevers, chills, cough. PT denies any associated dib, sweats, nausea. PT is a 1 ppd smoker. She has no premature CAD in the family and denies any hx of clotting disorder.   ROS 10 Systems reviewed and are negative for acute change except as noted in the HPI.     Patient is a 50 y.o. female presenting with chest pain and back pain. The history is provided by the patient.  Chest Pain Associated symptoms: back pain   Back Pain Associated symptoms: chest pain     Past Medical History  Diagnosis Date  . Cancer [facility])    Past Surgical History  Procedure Laterality Date  . Hemorrhoid surgery     No family history on file. Social History  Substance Use Topics  . Smoking status: Current Every Day Smoker -- 1.00 packs/day    Types: Cigarettes  . Smokeless tobacco: Never Used  . Alcohol Use: Yes   OB History    No data available     Review of Systems  Cardiovascular: Positive for chest pain.  Musculoskeletal: Positive for back pain.      Allergies  Penicillins  Home Medications   Prior to Admission medications   Not on File   BP 136/91 mmHg  Pulse 72  Temp(Src) 98.4 F (36.9 C) (Oral)  Resp 21  Ht 5\' 7"  (1.702 m)  Wt 139 lb (63.05 kg)  BMI 21.77 kg/m2  SpO2 97% Physical Exam  Constitutional: She is oriented to person, place, and time. She appears  well-developed.  HENT:  Head: Normocephalic and atraumatic.  Eyes: Conjunctivae and EOM are normal. Pupils are equal, round, and reactive to light.  Neck: Normal range of motion. Neck supple.  Cardiovascular: Normal rate, regular rhythm and intact distal pulses.  Exam reveals no friction rub.   Murmur heard. Pulmonary/Chest: Effort normal and breath sounds normal. No respiratory distress.  Abdominal: Soft. Bowel sounds are normal. She exhibits no distension. There is no tenderness. There is no rebound and no guarding.  Musculoskeletal: She exhibits no edema or tenderness.  Neurological: She is alert and oriented to person, place, and time.  Skin: Skin is warm and dry.  Nursing note and vitals reviewed.   ED Course  Procedures (including critical care time) Labs Review Labs Reviewed  BASIC METABOLIC PANEL - Abnormal; Notable for the following:    Glucose, Bld 104 (*)    All other components within normal limits  CBC - Abnormal; Notable for the following:    WBC 11.3 (*)    Hemoglobin 15.5 (*)    All other components within normal limits  HCG, QUANTITATIVE, PREGNANCY  I-STAT TROPOININ, ED  I-STAT TROPOININ, ED    Imaging Review Dg Chest 2 View  07/04/2016  CLINICAL DATA:  Left lower chest pain radiating to the arm since 4 a.m. today. Shortness breath.  EXAM: CHEST  2 VIEW COMPARISON:  03/23/2009 FINDINGS: There is new blunting of the left lateral costophrenic angle. Gas beneath the left hemidiaphragm appears to be contained in stomach and bowel. The lungs appear otherwise clear. Cardiac and mediastinal margins appear normal. IMPRESSION: 1. Small left pleural effusion with adjacent passive atelectasis. This is new compared to the exam from March 23, 2009. Electronically Signed   By: Van Clines M.D.   On: 07/04/2016 12:29   Ct Angio Chest Pe W/cm &/or Wo Cm  07/04/2016  CLINICAL DATA:  Patient presents for chest pain under left breast radiating to left mid back onset 0400 today.  Also c/o pain with deep inhalation and nausea. Denies diaphoreses, lightheadedness, dizziness, or emesis EXAM: CT ANGIOGRAPHY CHEST WITH CONTRAST TECHNIQUE: Multidetector CT imaging of the chest was performed using the standard protocol during bolus administration of intravenous contrast. Multiplanar CT image reconstructions and MIPs were obtained to evaluate the vascular anatomy. CONTRAST:  77 mL of Isovue 370 intravenous contrast COMPARISON:  Current chest radiograph FINDINGS: Angiographic study: No evidence of a pulmonary embolism. No aortic dissection. Minor plaque at the origin of the left subclavian artery. Common origin from the aortic arch for the left common carotid artery and innominate artery. Neck base and axilla: Several calcified lymph nodes in the left axilla. No masses or enlarged lymph nodes. Mediastinum and hila: Heart normal in size and configuration. No apparent coronary artery calcifications. No mediastinal or hilar masses or pathologically enlarged lymph nodes. Lungs and pleura: Mild centrilobular emphysema most evident in the upper lobes. Peribronchovascular patchy opacity at the base of the left lower lobe which may be due to atelectasis. Infection should be considered in the proper clinical setting. Minimal left pleural effusion. Minor subsegmental atelectasis in the posterior right lung base. No lung mass or suspicious nodule. No pneumothorax. Limited upper abdomen:  No acute findings. Musculoskeletal: Minor disc degenerative changes along the thoracic spine. No osteoblastic or osteolytic lesions. Review of the MIP images confirms the above findings. IMPRESSION: 1. No evidence of a pulmonary embolism. 2. Opacity at the base of the left lower lobe. This is likely atelectasis. Infection is possible. 3. No other evidence of an acute abnormality. 4. Mild centrilobular emphysema. Electronically Signed   By: Lajean Manes M.D.   On: 07/04/2016 14:26   I have personally reviewed and evaluated  these images and lab results as part of my medical decision-making.   EKG Interpretation   Date/Time:  Thursday July 04 2016 11:53:32 EDT Ventricular Rate:  81 PR Interval:    QRS Duration: 97 QT Interval:  402 QTC Calculation: 467 R Axis:   65 Text Interpretation:  Sinus rhythm Right atrial enlargement Probable LVH  with secondary repol abnrm No acute changes No significant change since  last tracing Confirmed by Kathrynn Humble, MD, [facility] [phone]) on 07/04/2016 1:17:05  PM      MDM   Final diagnoses:  Pleuritic chest pain  Left sided chest pain    Pt comes in with cc of chest pain. She has pleuritic chest pain that is worse with laying flat. EKG is not showing any acute pericarditis.  PT is on birth control, and PE is in the top 3 for ddx, higher than ACS - and so she is a wells score of 4.5 and moderate risk for PE. With the absence of clears signs of pericarditis on ekg we will get CT PE.  Varney Biles, MD 07/04/16 1428  3:18 PM Chest pain is persistent -  no response to nitro. Repeat trop pending. Repeat EKG is unchanged. Pt's CT PE is neg. Clinically, no pneumonia - antibiotics for wait and watch approach to be provided. Pericarditis still a possibility. HEAR score is 3 (1 for age, risk factors and history), so we will give her Cards info to f/u with if she doesn't get better. Strict ER return precautions have been discussed, and patient is agreeing with the plan and is comfortable with the workup done and the recommendations from the ER.    Varney Biles, MD 07/04/16 1524

## 2016-08-27 ENCOUNTER — Ambulatory Visit: Payer: BLUE CROSS/BLUE SHIELD | Admitting: Family

## 2016-09-04 ENCOUNTER — Encounter: Payer: Self-pay | Admitting: Family

## 2016-09-04 ENCOUNTER — Ambulatory Visit (INDEPENDENT_AMBULATORY_CARE_PROVIDER_SITE_OTHER): Payer: BLUE CROSS/BLUE SHIELD | Admitting: Family

## 2016-09-04 ENCOUNTER — Other Ambulatory Visit (INDEPENDENT_AMBULATORY_CARE_PROVIDER_SITE_OTHER): Payer: BLUE CROSS/BLUE SHIELD

## 2016-09-04 VITALS — BP 128/84 | HR 79 | Temp 97.9°F | Resp 18 | Ht 67.0 in | Wt 141.0 lb

## 2016-09-04 DIAGNOSIS — R8290 Unspecified abnormal findings in urine: Secondary | ICD-10-CM

## 2016-09-04 DIAGNOSIS — B182 Chronic viral hepatitis C: Secondary | ICD-10-CM

## 2016-09-04 DIAGNOSIS — R0781 Pleurodynia: Secondary | ICD-10-CM

## 2016-09-04 DIAGNOSIS — Z202 Contact with and (suspected) exposure to infections with a predominantly sexual mode of transmission: Secondary | ICD-10-CM

## 2016-09-04 DIAGNOSIS — Z1231 Encounter for screening mammogram for malignant neoplasm of breast: Secondary | ICD-10-CM

## 2016-09-04 LAB — HEPATIC FUNCTION PANEL
ALBUMIN: 4.3 g/dL (ref 3.5–5.2)
ALK PHOS: 65 U/L (ref 39–117)
ALT: 52 U/L — ABNORMAL HIGH (ref 0–35)
AST: 49 U/L — ABNORMAL HIGH (ref 0–37)
Bilirubin, Direct: 0.1 mg/dL (ref 0.0–0.3)
TOTAL PROTEIN: 7.1 g/dL (ref 6.0–8.3)
Total Bilirubin: 0.5 mg/dL (ref 0.2–1.2)

## 2016-09-04 NOTE — Patient Instructions (Addendum)
Thank you for choosing [facility].  SUMMARY AND INSTRUCTIONS:  Medication:  Your prescription(s) have been submitted to your pharmacy or been printed and provided for you. Please take as directed and contact our office if you believe you are having problem(s) with the medication(s) or have any questions.  Labs:  Please stop by the lab on the lower level of the building for your blood work. Your results will be released to [place] (or called to you) after review, usually within 72 hours after test completion. If any changes need to be made, you will be notified at that same time.  1.) The lab is open from 7:30am to 5:30 pm Monday-Friday 2.) No appointment is necessary 3.) Fasting (if needed) is 6-8 hours after food and drink; black coffee and water are okay   Follow up:  If your symptoms worsen or fail to improve, please contact our office for further instruction, or in case of emergency go directly to the emergency room at the closest medical facility.    Hepatitis C Hepatitis C is a viral infection of the liver. It can lead to scarring of the liver (cirrhosis), liver failure, or liver cancer. Hepatitis C may go undetected for months or years because people with the infection may not have symptoms, or they may have only mild symptoms. CAUSES  Hepatitis C is caused by the hepatitis C virus (HCV). The virus can be passed from one person to another through:  Blood.  Contaminated needles, such as those used for tattooing, body piercing, acupuncture, or injecting drugs.  Having unprotected sex with an infected person.  Childbirth.  Blood transfusions or organ transplants done in the [place] before 1992. RISK FACTORS Risk factors for hepatitis C include:  Having unprotected sex with an infected person.  Using illegal drugs. SIGNS AND SYMPTOMS  Symptoms of hepatitis C may include:  Fatigue.  Loss of appetite.  Nausea.  Vomiting.  Abdominal pain.  Dark  yellow urine.  Yellowish skin and eyes (jaundice).  Itching of the skin.  Clay-colored bowel movements.  Joint pain. Symptoms are not always present.  DIAGNOSIS  Hepatitis C is diagnosed with blood tests. Other types of tests may also be done to check how your liver is functioning. TREATMENT  Your health care provider may perform noninvasive tests or a liver biopsy to help determine the best course of treatment. Treatment for hepatitis C may include one or more medicines. Your health care provider may check you for a recurring infection or other liver conditions every 6-12 months after treatment. HOME CARE INSTRUCTIONS   Rest as needed.  Take all medicines as directed by your health care provider.  Do not take any medicine unless approved by your health care provider. This includes over-the-counter medicine and birth control pills.  Do not drink alcohol.  Do not have sex until approved by your health care provider.  Do not share toothbrushes, nail clippers, razors, or needles. PREVENTION There is no vaccine for hepatitis C. The only way to prevent the disease is to reduce the risk of exposure to the virus. This may be done by:  Practicing safe sex and using condoms.  Avoiding illegal drugs. SEEK MEDICAL CARE IF:  You have a fever.  You develop abdominal pain.  You develop dark urine.  You have clay-colored bowel movements.  You develop joint pains. SEEK IMMEDIATE MEDICAL CARE IF:  You have increasing fatigue or weakness.  You lose your appetite.  You feel nauseous or vomit.  You develop jaundice or your jaundice gets worse.  You bruise or bleed easily. MAKE SURE YOU:   Understand these instructions.  Will watch your condition.  Will get help right away if you are not doing well or get worse.   This information is not intended to replace advice given to you by your health care provider. Make sure you discuss any questions you have with your health care  provider.   Document Released: 12/06/2000 Document Revised: 12/30/2014 Document Reviewed: 03/23/2014 Elsevier Interactive Patient Education [employer].

## 2016-09-04 NOTE — Addendum Note (Signed)
Addended by: Delice Bison E on: 09/04/2016 03:36 PM   Modules accepted: Orders

## 2016-09-04 NOTE — Assessment & Plan Note (Signed)
Previously diagnosed with hepatitis C with no treatment noted. Obtain hepatic function panel and hepatitis C RNA. Discussed possibility of medication and treatment patient wishes to hold off at this time. Continue to monitor.

## 2016-09-04 NOTE — Assessment & Plan Note (Signed)
Pleuritic chest pain appears resolved with concern for pericarditis given improvement following azithromycin and prednisone. No further symptoms have been noted since resolution. No further treatment necessary at this time. Continue to monitor and follow-up if symptoms return.

## 2016-09-04 NOTE — Progress Notes (Signed)
Subjective:    Patient ID: Kristine Madden, female    DOB: 1966/09/27, 50 y.o.   MRN: [id number]  Chief Complaint  Patient presents with  . Establish Care    was in the hospital a couple weeks ago, had a chest xray done and fluid was seen on the lung just wanted to talk about her hospital visit, std check partner has clamydia,    HPI:  Kristine Madden is a 50 y.o. female who  has a past medical history of Cancer ([place]); Hepatitis C; and Hyperlipidemia. and presents today for an office visit to establish care.  1.) Pleuritic chest pain -  Previously evaluated in the emergency department for sudden onset left-sided chest pain described as constant and worsened with inspiration and lying flat. EKG at the time showed sinus rhythm with right atrial enlargement and probable left ventricular hypertrophy. There is no evidence of pericarditis on the EKG. A CT scanning of the chest was negative for pulmonary embolism with no evidence of pneumonia. She was given antibiotics with concern for potential pericarditis and injections of follow-up if symptoms do not improve.  Notes that since she left the ED she has not experienced any additional pain. The pain went away after about 3 days. She did complete the antibiotic and the prednisone.    2.) Concern for sexual transmitted disease  - this is a new problem. Patient is requesting a screening for sexual transmitted disease as a partner has tested positive for chlamydia. Not currently experiencing any symptoms that she noted. No vaginal dischrage, dysuria, urinary freqeuncy or urgency.   3.) Hepatitis C -  Previously diagnosed with hepatitis C. Has not received treatment at this point. No current abdominal pain, cramping, or other gastrointestinal symptoms. Previously monitored through hepatic function tests which have remained normal.   Allergies  Allergen Reactions  . Penicillins Hives    Has patient had a PCN reaction causing immediate rash,  facial/tongue/throat swelling, SOB or lightheadedness with hypotension: No Has patient had a PCN reaction causing severe rash involving mucus membranes or skin necrosis: No Has patient had a PCN reaction that required hospitalization No Has patient had a PCN reaction occurring within the last 10 years: No If all of the above answers are "NO", then may proceed with Cephalosporin use.       Outpatient Medications Prior to Visit  Medication Sig Dispense Refill  . ibuprofen (ADVIL,MOTRIN) 600 MG tablet Take 1 tablet (600 mg total) by mouth every 6 (six) hours as needed. 30 tablet 0  . azithromycin (ZITHROMAX) 250 MG tablet Take 1 tablet (250 mg total) by mouth daily. Take first 2 tablets together, then 1 every day until finished. 6 tablet 0  . predniSONE (DELTASONE) 10 MG tablet Take 6 tablets (60 mg total) by mouth daily. 30 tablet 0   No facility-administered medications prior to visit.       Past Surgical History:  Procedure Laterality Date  . HEMORRHOID SURGERY    . SPLENECTOMY, TOTAL    . TUBAL LIGATION        Past Medical History:  Diagnosis Date  . Cancer [facility])    islet cell tumor  . Hepatitis C   . Hyperlipidemia       Review of Systems  Respiratory: Negative for chest tightness and shortness of breath.   Cardiovascular: Negative for chest pain, palpitations and leg swelling.  Gastrointestinal: Negative for abdominal pain, blood in stool, constipation, diarrhea, nausea and vomiting.  Genitourinary: Negative  for decreased urine volume, dysuria, flank pain, frequency, hematuria, pelvic pain, urgency and vaginal discharge.      Objective:    BP 128/84 (BP Location: Left Arm, Patient Position: Sitting, Cuff Size: Normal)   Pulse 79   Temp 97.9 F (36.6 C) (Oral)   Resp 18   Ht 5\' 7"  (1.702 m)   Wt 141 lb (64 kg)   SpO2 97%   BMI 22.08 kg/m  Nursing note and vital signs reviewed.  Physical Exam  Constitutional: She is oriented to person, place, and time.  She appears well-developed and well-nourished. No distress.  Cardiovascular: Normal rate, regular rhythm, normal heart sounds and intact distal pulses.   Pulmonary/Chest: Effort normal and breath sounds normal.  Abdominal: Soft. Bowel sounds are normal. She exhibits no distension and no mass. There is no tenderness. There is no rebound and no guarding.  Neurological: She is alert and oriented to person, place, and time.  Skin: Skin is warm and dry.  Psychiatric: She has a normal mood and affect. Her behavior is normal. Judgment and thought content normal.       Assessment & Plan:   Problem List Items Addressed This Visit      Digestive   Hepatitis C    Previously diagnosed with hepatitis C with no treatment noted. Obtain hepatic function panel and hepatitis C RNA. Discussed possibility of medication and treatment patient wishes to hold off at this time. Continue to monitor.      Relevant Orders   Hepatic function panel   Hepatitis C RNA quantitative     Other   Exposure to STD    Patient with exposure to chlamydia and a partner with no current symptoms. Obtain GC chlamydia probe. Follow-up if symptoms develop or pending results.      Pleuritic chest pain    Pleuritic chest pain appears resolved with concern for pericarditis given improvement following azithromycin and prednisone. No further symptoms have been noted since resolution. No further treatment necessary at this time. Continue to monitor and follow-up if symptoms return.       Other Visit Diagnoses    Encounter for screening mammogram for breast cancer    -  Primary   Relevant Orders   MM DIGITAL SCREENING BILATERAL       I have discontinued Ms. Rumery's predniSONE and azithromycin. I am also having her maintain her ibuprofen.   Follow-up: Return in about 3 months (around 12/04/2016), or if symptoms worsen or fail to improve.  Mauricio Po, FNP

## 2016-09-04 NOTE — Assessment & Plan Note (Signed)
Patient with exposure to chlamydia and a partner with no current symptoms. Obtain GC chlamydia probe. Follow-up if symptoms develop or pending results.

## 2016-09-05 LAB — GC/CHLAMYDIA PROBE AMP
CT Probe RNA: NOT DETECTED
GC Probe RNA: NOT DETECTED

## 2016-09-05 LAB — HEPATITIS C RNA QUANTITATIVE
HCV QUANT: 1077107 [IU]/mL — AB (ref <15)
HCV Quantitative Log: 6.03 {Log} — ABNORMAL HIGH (ref <1.18)

## 2016-09-22 DIAGNOSIS — I319 Disease of pericardium, unspecified: Secondary | ICD-10-CM

## 2016-09-22 HISTORY — DX: Disease of pericardium, unspecified: I31.9

## 2016-10-25 ENCOUNTER — Ambulatory Visit: Payer: BLUE CROSS/BLUE SHIELD | Admitting: Family

## 2016-12-10 ENCOUNTER — Other Ambulatory Visit: Payer: Self-pay | Admitting: Obstetrics & Gynecology

## 2016-12-18 ENCOUNTER — Encounter (HOSPITAL_COMMUNITY): Payer: Self-pay | Admitting: *Deleted

## 2016-12-26 NOTE — Patient Instructions (Signed)
Your procedure is scheduled on:  Friday, Jan. 12, 2018  Enter through the [employer] [facility] at:  1:00 PM  Pick up the phone at the desk and dial [phone].  Call this number if you have problems the morning of surgery: [phone].  Remember: Do NOT eat food:  After Midnight Thursday  Do NOT drink clear liquids after:  10:30 AM day of surgery  Take these medicines the morning of surgery with a SIP OF WATER:  None  Stop ALL herbal medications at this time   Do NOT wear jewelry (body piercing), metal hair clips/bobby pins, make-up, or nail polish. Do NOT wear lotions, powders, or perfumes.  You may wear deodorant. Do NOT shave for 48 hours prior to surgery. Do NOT bring valuables to the hospital. Contacts, dentures, or bridgework may not be worn into surgery.  Have a responsible adult drive you home and stay with you for 24 hours after your procedure

## 2016-12-27 ENCOUNTER — Encounter (HOSPITAL_COMMUNITY): Payer: Self-pay

## 2016-12-27 ENCOUNTER — Encounter (HOSPITAL_COMMUNITY)
Admission: RE | Admit: 2016-12-27 | Discharge: 2016-12-27 | Disposition: A | Discharge status: Home / Self Care | Payer: BLUE CROSS/BLUE SHIELD | Source: Ambulatory Visit | Attending: Obstetrics & Gynecology | Admitting: Obstetrics & Gynecology

## 2016-12-27 DIAGNOSIS — Z01812 Encounter for preprocedural laboratory examination: Secondary | ICD-10-CM | POA: Diagnosis not present

## 2016-12-27 HISTORY — DX: Unspecified osteoarthritis, unspecified site: M19.90

## 2016-12-27 LAB — CBC
HCT: 44.3 % (ref 36.0–46.0)
Hemoglobin: 15.6 g/dL — ABNORMAL HIGH (ref 12.0–15.0)
MCH: 33 pg (ref 26.0–34.0)
MCHC: 35.2 g/dL (ref 30.0–36.0)
MCV: 93.7 fL (ref 78.0–100.0)
Platelets: 323 10*3/uL (ref 150–400)
RBC: 4.73 MIL/uL (ref 3.87–5.11)
RDW: 13.9 % (ref 11.5–15.5)
WBC: 7.9 10*3/uL (ref 4.0–10.5)

## 2017-01-02 MED ORDER — METRONIDAZOLE IN NACL 5-0.79 MG/ML-% IV SOLN
500.0000 mg | Freq: Three times a day (TID) | INTRAVENOUS | Status: DC
  Filled 2017-01-02 (×5): qty 100

## 2017-01-02 MED ORDER — METRONIDAZOLE IN NACL 5-0.79 MG/ML-% IV SOLN
500.0000 mg | Freq: Once | INTRAVENOUS | Status: AC
  Administered 2017-01-03: 500 mg via INTRAVENOUS
  Filled 2017-01-02: qty 100

## 2017-01-03 ENCOUNTER — Encounter (HOSPITAL_COMMUNITY)
Admission: RE | Disposition: A | Discharge status: Home / Self Care | Payer: Self-pay | Source: Ambulatory Visit | Attending: Obstetrics & Gynecology

## 2017-01-03 ENCOUNTER — Encounter (HOSPITAL_COMMUNITY): Payer: Self-pay

## 2017-01-03 ENCOUNTER — Ambulatory Visit (HOSPITAL_COMMUNITY): Payer: BLUE CROSS/BLUE SHIELD | Admitting: Anesthesiology

## 2017-01-03 ENCOUNTER — Ambulatory Visit (HOSPITAL_COMMUNITY)
Admission: RE | Admit: 2017-01-03 | Discharge: 2017-01-03 | Disposition: A | Discharge status: Home / Self Care | Payer: BLUE CROSS/BLUE SHIELD | Source: Ambulatory Visit | Attending: Obstetrics & Gynecology | Admitting: Obstetrics & Gynecology

## 2017-01-03 DIAGNOSIS — E785 Hyperlipidemia, unspecified: Secondary | ICD-10-CM | POA: Insufficient documentation

## 2017-01-03 DIAGNOSIS — B1920 Unspecified viral hepatitis C without hepatic coma: Secondary | ICD-10-CM | POA: Diagnosis not present

## 2017-01-03 DIAGNOSIS — F17210 Nicotine dependence, cigarettes, uncomplicated: Secondary | ICD-10-CM | POA: Insufficient documentation

## 2017-01-03 DIAGNOSIS — Z880 Allergy status to penicillin: Secondary | ICD-10-CM | POA: Insufficient documentation

## 2017-01-03 DIAGNOSIS — N840 Polyp of corpus uteri: Secondary | ICD-10-CM | POA: Diagnosis not present

## 2017-01-03 DIAGNOSIS — M1990 Unspecified osteoarthritis, unspecified site: Secondary | ICD-10-CM | POA: Diagnosis not present

## 2017-01-03 DIAGNOSIS — N921 Excessive and frequent menstruation with irregular cycle: Secondary | ICD-10-CM | POA: Insufficient documentation

## 2017-01-03 HISTORY — PX: DILATATION & CURETTAGE/HYSTEROSCOPY WITH MYOSURE: SHX6511

## 2017-01-03 HISTORY — DX: Disease of pericardium, unspecified: I31.9

## 2017-01-03 LAB — PREGNANCY, URINE: Preg Test, Ur: NEGATIVE

## 2017-01-03 SURGERY — DILATATION & CURETTAGE/HYSTEROSCOPY WITH MYOSURE
Anesthesia: General

## 2017-01-03 MED ORDER — LIDOCAINE HCL (CARDIAC) 20 MG/ML IV SOLN
INTRAVENOUS | Status: AC
  Filled 2017-01-03: qty 5

## 2017-01-03 MED ORDER — HYDROCODONE-IBUPROFEN 5-200 MG PO TABS: 1.0000 | ORAL_TABLET | Freq: Three times a day (TID) | ORAL | 0 refills | Status: DC | PRN

## 2017-01-03 MED ORDER — SCOPOLAMINE 1 MG/3DAYS TD PT72
1.0000 | MEDICATED_PATCH | TRANSDERMAL | Status: DC
  Administered 2017-01-03: 1.5 mg via TRANSDERMAL

## 2017-01-03 MED ORDER — PROPOFOL 10 MG/ML IV BOLUS
INTRAVENOUS | Status: AC
  Filled 2017-01-03: qty 20

## 2017-01-03 MED ORDER — KETOROLAC TROMETHAMINE 30 MG/ML IJ SOLN
INTRAMUSCULAR | Status: DC | PRN
  Administered 2017-01-03: 30 mg via INTRAVENOUS

## 2017-01-03 MED ORDER — EPHEDRINE 5 MG/ML INJ
INTRAVENOUS | Status: AC
  Filled 2017-01-03: qty 10

## 2017-01-03 MED ORDER — PROMETHAZINE HCL 25 MG/ML IJ SOLN: 6.2500 mg | INTRAMUSCULAR | Status: DC | PRN

## 2017-01-03 MED ORDER — PHENYLEPHRINE 40 MCG/ML (10ML) SYRINGE FOR IV PUSH (FOR BLOOD PRESSURE SUPPORT)
PREFILLED_SYRINGE | INTRAVENOUS | Status: AC
  Filled 2017-01-03: qty 10

## 2017-01-03 MED ORDER — OXYCODONE-ACETAMINOPHEN 7.5-325 MG PO TABS
1.0000 | ORAL_TABLET | ORAL | 0 refills | Status: DC | PRN
Start: 2017-01-03 — End: 2017-05-27

## 2017-01-03 MED ORDER — LIDOCAINE HCL (CARDIAC) 20 MG/ML IV SOLN
INTRAVENOUS | Status: DC | PRN
  Administered 2017-01-03: 60 mg via INTRAVENOUS

## 2017-01-03 MED ORDER — FENTANYL CITRATE (PF) 100 MCG/2ML IJ SOLN
INTRAMUSCULAR | Status: AC
  Filled 2017-01-03: qty 2

## 2017-01-03 MED ORDER — CHLOROPROCAINE HCL 1 % IJ SOLN
INTRAMUSCULAR | Status: DC | PRN
  Administered 2017-01-03: 20 mL

## 2017-01-03 MED ORDER — SCOPOLAMINE 1 MG/3DAYS TD PT72
MEDICATED_PATCH | TRANSDERMAL | Status: AC
  Filled 2017-01-03: qty 1

## 2017-01-03 MED ORDER — LACTATED RINGERS IV SOLN
INTRAVENOUS | Status: DC
  Administered 2017-01-03: 125 mL/h via INTRAVENOUS
  Administered 2017-01-03: 14:00:00 via INTRAVENOUS

## 2017-01-03 MED ORDER — HYDROMORPHONE HCL 1 MG/ML IJ SOLN: 0.2500 mg | INTRAMUSCULAR | Status: DC | PRN

## 2017-01-03 MED ORDER — KETOROLAC TROMETHAMINE 30 MG/ML IJ SOLN
INTRAMUSCULAR | Status: AC
  Filled 2017-01-03: qty 1

## 2017-01-03 MED ORDER — GLYCOPYRROLATE 0.2 MG/ML IJ SOLN
INTRAMUSCULAR | Status: AC
  Filled 2017-01-03: qty 1

## 2017-01-03 MED ORDER — PROPOFOL 10 MG/ML IV BOLUS
INTRAVENOUS | Status: DC | PRN
  Administered 2017-01-03: 170 mg via INTRAVENOUS

## 2017-01-03 MED ORDER — CHLOROPROCAINE HCL 1 % IJ SOLN
INTRAMUSCULAR | Status: AC
  Filled 2017-01-03: qty 30

## 2017-01-03 MED ORDER — DEXAMETHASONE SODIUM PHOSPHATE 4 MG/ML IJ SOLN
INTRAMUSCULAR | Status: AC
  Filled 2017-01-03: qty 1

## 2017-01-03 MED ORDER — ONDANSETRON HCL 4 MG/2ML IJ SOLN
INTRAMUSCULAR | Status: DC | PRN
  Administered 2017-01-03: 4 mg via INTRAVENOUS

## 2017-01-03 MED ORDER — PHENYLEPHRINE HCL 10 MG/ML IJ SOLN
INTRAMUSCULAR | Status: DC | PRN
  Administered 2017-01-03 (×5): 80 ug via INTRAVENOUS

## 2017-01-03 MED ORDER — MIDAZOLAM HCL 2 MG/2ML IJ SOLN
INTRAMUSCULAR | Status: DC | PRN
  Administered 2017-01-03: 2 mg via INTRAVENOUS

## 2017-01-03 MED ORDER — GLYCOPYRROLATE 0.2 MG/ML IJ SOLN
INTRAMUSCULAR | Status: DC | PRN
  Administered 2017-01-03 (×2): 0.1 mg via INTRAVENOUS

## 2017-01-03 MED ORDER — SCOPOLAMINE 1 MG/3DAYS TD PT72: 1.0000 | MEDICATED_PATCH | Freq: Once | TRANSDERMAL | Status: DC

## 2017-01-03 MED ORDER — EPHEDRINE SULFATE 50 MG/ML IJ SOLN
INTRAMUSCULAR | Status: DC | PRN
  Administered 2017-01-03: 5 mg via INTRAVENOUS

## 2017-01-03 MED ORDER — MIDAZOLAM HCL 2 MG/2ML IJ SOLN
INTRAMUSCULAR | Status: AC
  Filled 2017-01-03: qty 2

## 2017-01-03 MED ORDER — ONDANSETRON HCL 4 MG/2ML IJ SOLN
INTRAMUSCULAR | Status: AC
  Filled 2017-01-03: qty 2

## 2017-01-03 MED ORDER — FENTANYL CITRATE (PF) 100 MCG/2ML IJ SOLN
INTRAMUSCULAR | Status: DC | PRN
  Administered 2017-01-03 (×2): 50 ug via INTRAVENOUS

## 2017-01-03 MED ORDER — DEXAMETHASONE SODIUM PHOSPHATE 4 MG/ML IJ SOLN
INTRAMUSCULAR | Status: DC | PRN
  Administered 2017-01-03: 4 mg via INTRAVENOUS

## 2017-01-03 SURGICAL SUPPLY — 20 items
CANISTER SUCT 3000ML (MISCELLANEOUS) ×3 IMPLANT
CATH ROBINSON RED A/P 16FR (CATHETERS) ×3 IMPLANT
CLOTH BEACON ORANGE TIMEOUT ST (SAFETY) ×3 IMPLANT
CONTAINER PREFILL 10% NBF 60ML (FORM) ×6 IMPLANT
DEVICE MYOSURE LITE (MISCELLANEOUS) ×3 IMPLANT
DEVICE MYOSURE REACH (MISCELLANEOUS) IMPLANT
ELECT REM PT RETURN 9FT ADLT (ELECTROSURGICAL) ×3
ELECTRODE REM PT RTRN 9FT ADLT (ELECTROSURGICAL) ×1 IMPLANT
FILTER ARTHROSCOPY CONVERTOR (FILTER) ×6 IMPLANT
GLOVE BIO SURGEON STRL SZ 6.5 (GLOVE) ×2 IMPLANT
GLOVE BIO SURGEONS STRL SZ 6.5 (GLOVE) ×1
GLOVE BIOGEL PI IND STRL 7.0 (GLOVE) ×2 IMPLANT
GLOVE BIOGEL PI INDICATOR 7.0 (GLOVE) ×4
GOWN STRL REUS W/TWL LRG LVL3 (GOWN DISPOSABLE) ×6 IMPLANT
PACK VAGINAL MINOR WOMEN LF (CUSTOM PROCEDURE TRAY) ×3 IMPLANT
PAD OB MATERNITY 4.3X12.25 (PERSONAL CARE ITEMS) ×3 IMPLANT
SEAL ROD LENS SCOPE MYOSURE (ABLATOR) ×3 IMPLANT
TOWEL OR 17X24 6PK STRL BLUE (TOWEL DISPOSABLE) ×6 IMPLANT
TUBING AQUILEX INFLOW (TUBING) ×3 IMPLANT
TUBING AQUILEX OUTFLOW (TUBING) ×3 IMPLANT

## 2017-01-03 NOTE — Op Note (Signed)
01/03/2017  2:29 PM  PATIENT:  Kristine Madden  51 y.o. female  PRE-OPERATIVE DIAGNOSIS:  Intrauterine Polyps, Metrorrhagia  POST-OPERATIVE DIAGNOSIS:  Intrauterine Polyps, Metrorrhagia  PROCEDURE:  Procedure(s): DILATATION & CURETTAGE/HYSTEROSCOPY WITH MYOSURE RESECTION  SURGEON:  Surgeon(s): [name], MD  ASSISTANTS: none   ANESTHESIA:   general   PROCEDURE:  Under general anesthesia with laryngeal mask the patient is in lithotomy position. She is prepped with Betadine on the suprapubic, vulvar and vaginal areas. She is draped as usual. The bladder is catheterized. The vaginal exam reveals an anteverted uterus no adnexal mass. The speculum is inserted in the vagina and the anterior lip of the cervix is grasped with a tenaculum. A paracervical block is done with Nesacaine 1% a total of 20 cc at 4 and 8:00.  Dilation of the cervix with Hegar dilators up to #23 without difficulty.  The hysteroscope is inserted in the intrauterine cavity and inspection confirms many intrauterine polyps, the largest one coming off of the anterior wall.  The Myosure instrument is inserted and resection of all the polyps is performed without difficulty.  Pictures are taken after resection. Both ostia are well visualized.  Hemostasis is adequate.  The hysteroscope is removed.  A systematic curettage of the intrauterine cavity is done with a small sharp curette on all surfaces. Both specimens are sent to pathology together.  The tenaculum is removed from the cervix. Hemostasis is adequate. The speculum is removed. The patient is brought to recovery room in good and stable status.  ESTIMATED BLOOD LOSS: 5 cc FLUID DEFICIT:  450 cc   Intake/Output Summary (Last 24 hours) at 01/03/17 1429 Last data filed at 01/03/17 1423  Gross per 24 hour  Intake             1100 ml  Output              105 ml  Net              995 ml     BLOOD ADMINISTERED:none   LOCAL MEDICATIONS USED:  Nesacaine 1%, Amount: 20  ml  SPECIMEN:  Source of Specimen:  Endometrial polyps and Endometrial curettings  DISPOSITION OF SPECIMEN:  PATHOLOGY  COUNTS:  YES  PLAN OF CARE: Transfer to PACU  [name] MD  01/03/2017 at 2:30 pm

## 2017-01-03 NOTE — Anesthesia Postprocedure Evaluation (Signed)
Anesthesia Post Note  Patient: Kristine Madden  Procedure(s) Performed: Procedure(s) (LRB): DILATATION & CURETTAGE/HYSTEROSCOPY WITH MYOSURE (N/A)  Patient location during evaluation: PACU Anesthesia Type: General Level of consciousness: sedated Pain management: pain level controlled Vital Signs Assessment: post-procedure vital signs reviewed and stable Respiratory status: spontaneous breathing and respiratory function stable Cardiovascular status: stable Anesthetic complications: no        Last Vitals:  Vitals:   01/03/17 1515 01/03/17 1530  BP: 109/66 111/75  Pulse: 63 63  Resp: 12 17  Temp:  36.6 C    Last Pain:  Vitals:   01/03/17 1530  TempSrc:   PainSc: 0-No pain   Pain Goal: Patients Stated Pain Goal: 3 (01/03/17 1530)               Duane Boston DANIEL

## 2017-01-03 NOTE — Discharge Summary (Addendum)
Physician Discharge Summary  Patient ID: Kristine Madden MRN: [id number] DOB/AGE: 1966/08/09 51 y.o.  Admit date: 01/03/2017 Discharge date: 01/03/2017  Admission Diagnoses: Intrauterine Polyp, Metrorrhagia  Discharge Diagnoses: Intrauterine Polyp, Metrorrhagia        Active Problems:   * No active hospital problems. *   Discharged Condition: good  Hospital Course: Outpatient  Consults: None  Treatments: surgery: Hysteroscopy with Myosure resection, D+C  Disposition: 01-Home or Self Care  Medication List:  Percocet 1 tab PO PRN q 4 hrs (4 tab), no refill Ibuprofen (200 mg/tab) 2 tab PO q6 hrs PRN (Over-the-Counter)  Follow-up Information    [name], MD Follow up in 3 week(s).   Specialty:  Obstetrics and Gynecology Contact information: [place] [address] [phone]           Signed: [name], MD 01/03/2017, 2:45 PM

## 2017-01-03 NOTE — Anesthesia Preprocedure Evaluation (Signed)
Anesthesia Evaluation  Patient identified by MRN, date of birth, ID band Patient awake    Reviewed: Allergy & Precautions, NPO status , Patient's Chart, lab work & pertinent test results  Airway Mallampati: II  TM Distance: >3 FB Neck ROM: Full    Dental  (+) Poor Dentition, Dental Advisory Given   Pulmonary Current Smoker,    Pulmonary exam normal        Cardiovascular negative cardio ROS Normal cardiovascular exam     Neuro/Psych negative neurological ROS  negative psych ROS   GI/Hepatic negative GI ROS, Neg liver ROS,   Endo/Other  negative endocrine ROS  Renal/GU negative Renal ROS  negative genitourinary   Musculoskeletal negative musculoskeletal ROS (+)   Abdominal   Peds negative pediatric ROS (+)  Hematology negative hematology ROS (+)   Anesthesia Other Findings   Reproductive/Obstetrics negative OB ROS                             Anesthesia Physical Anesthesia Plan  ASA: II  Anesthesia Plan: General   Post-op Pain Management:    Induction: Intravenous  Airway Management Planned: LMA  Additional Equipment:   Intra-op Plan:   Post-operative Plan: Extubation in OR  Informed Consent: I have reviewed the patients History and Physical, chart, labs and discussed the procedure including the risks, benefits and alternatives for the proposed anesthesia with the patient or authorized representative who has indicated his/her understanding and acceptance.   Dental advisory given  Plan Discussed with: Anesthesiologist and CRNA  Anesthesia Plan Comments:         Anesthesia Quick Evaluation

## 2017-01-03 NOTE — Discharge Instructions (Signed)
Hysteroscopy, Care After °Refer to this sheet in the next few weeks. These instructions provide you with information on caring for yourself after your procedure. Your health care provider may also give you more specific instructions. Your treatment has been planned according to current medical practices, but problems sometimes occur. Call your health care provider if you have any problems or questions after your procedure.  °WHAT TO EXPECT AFTER THE PROCEDURE °After your procedure, it is typical to have the following: °· You may have some cramping. This normally lasts for a couple days. °· You may have bleeding. This can vary from light spotting for a few days to menstrual-like bleeding for 3-7 days. °HOME CARE INSTRUCTIONS °· Rest for the first 1-2 days after the procedure. °· Only take over-the-counter or prescription medicines as directed by your health care provider. Do not take aspirin. It can increase the chances of bleeding. °· Take showers instead of baths for 2 weeks or as directed by your health care provider. °· Do not drive for 24 hours or as directed. °· Do not drink alcohol while taking pain medicine. °· Do not use tampons, douche, or have sexual intercourse for 2 weeks or until your health care provider says it is okay. °· Take your temperature twice a day for 4-5 days. Write it down each time. °· Follow your health care provider's advice about diet, exercise, and lifting. °· If you develop constipation, you may: °¨ Take a mild laxative if your health care provider approves. °¨ Add bran foods to your diet. °¨ Drink enough fluids to keep your urine clear or pale yellow. °· Try to have someone with you or available to you for the first 24-48 hours, especially if you were given a general anesthetic. °· Follow up with your health care provider as directed. °SEEK MEDICAL CARE IF: °· You feel dizzy or lightheaded. °· You feel sick to your stomach (nauseous). °· You have abnormal vaginal discharge. °· You  have a rash. °· You have pain that is not controlled with medicine. °SEEK IMMEDIATE MEDICAL CARE IF: °· You have bleeding that is heavier than a normal menstrual period. °· You have a fever. °· You have increasing cramps or pain, not controlled with medicine. °· You have new belly (abdominal) pain. °· You pass out. °· You have pain in the tops of your shoulders (shoulder strap areas). °· You have shortness of breath. °This information is not intended to replace advice given to you by your health care provider. Make sure you discuss any questions you have with your health care provider. °Document Released: 09/29/2013 Document Reviewed: 09/29/2013 °Elsevier Interactive Patient Education © 2017 [employer] ° °

## 2017-01-03 NOTE — Anesthesia Procedure Notes (Signed)
Procedure Name: LMA Insertion Date/Time: 01/03/2017 1:49 PM Performed by: Raenette Rover Pre-anesthesia Checklist: Patient identified, Emergency Drugs available, Suction available and Patient being monitored Patient Re-evaluated:Patient Re-evaluated prior to inductionOxygen Delivery Method: Circle system utilized Preoxygenation: Pre-oxygenation with 100% oxygen Intubation Type: IV induction LMA: LMA inserted LMA Size: 4.0 Number of attempts: 1 Placement Confirmation: positive ETCO2,  CO2 detector and breath sounds checked- equal and bilateral Tube secured with: Tape Dental Injury: Teeth and Oropharynx as per pre-operative assessment

## 2017-01-03 NOTE — H&P (Signed)
Kristine Madden is an 51 y.o. female  G1P1  RP:  Menometro with IU Polyp  Pertinent Gynecological History:  Contraception: tubal ligation Blood transfusions: none Sexually transmitted diseases: no past history Previous GYN Procedures: TL  Last mammogram: normal Last pap: normal   Menstrual History:  No LMP recorded. Patient is postmenopausal.    Past Medical History:  Diagnosis Date  . Arthritis   . Cancer [facility]) 2010   islet cell tumor  . Hepatitis C   . Hyperlipidemia   . Pericarditis 09/2016    Past Surgical History:  Procedure Laterality Date  . EYE SURGERY    . HEMORRHOID SURGERY    . KNEE ARTHROSCOPY    . 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 SPLENECTOMY, TOTAL    . TONSILLECTOMY    . TUBAL LIGATION      Family History  Problem Relation Age of Onset  . Arthritis Mother   . Hyperlipidemia Mother   . Hypertension Father     Social History:  reports that she has been smoking Cigarettes.  She has a 15.00 pack-year smoking history. She has never used smokeless tobacco. She reports that she drinks about 1.8 - 2.4 oz of alcohol per week . She reports that she does not use drugs.  Allergies:  Allergies  Allergen Reactions  . Hydrocodone Nausea And Vomiting  . Penicillins Hives    Has patient had a PCN reaction causing immediate rash, facial/tongue/throat swelling, SOB or lightheadedness with hypotension: No Has patient had a PCN reaction causing severe rash involving mucus membranes or skin necrosis: No Has patient had a PCN reaction that required hospitalization No Has patient had a PCN reaction occurring within the last 10 years: No If all of the above answers are "NO", then may proceed with Cephalosporin use.     Prescriptions Prior to Admission  Medication Sig Dispense Refill Last Dose  . ibuprofen (ADVIL,MOTRIN) 200 MG tablet Take 400 mg by mouth every 6 (six) hours as needed for headache or moderate pain.     Marland Kitchen ibuprofen (ADVIL,MOTRIN) 600 MG  tablet Take 1 tablet (600 mg total) by mouth every 6 (six) hours as needed. (Patient not taking: Reported on 12/20/2016) 30 tablet 0 Completed Course at Unknown time    ROS Neg  Height 5\' 7"  (1.702 m), weight 139 lb (63 kg). Physical Exam  Pelvic US 11/2016  1.8 cm IU polyp   Assessment/Plan: Menometro with IU Polyp for [facility] Myosure resection, D+C.  Informed consent obtained.  Marie-Lyne Tava Peery 01/03/2017, 1:03 PM

## 2017-01-03 NOTE — Transfer of Care (Signed)
Immediate Anesthesia Transfer of Care Note  Patient: Kristine Madden  Procedure(s) Performed: Procedure(s): DILATATION & CURETTAGE/HYSTEROSCOPY WITH MYOSURE (N/A)  Patient Location: PACU  Anesthesia Type:General  Level of Consciousness: awake, alert , oriented and patient cooperative  Airway & Oxygen Therapy: Patient Spontanous Breathing and Patient connected to nasal cannula oxygen  Post-op Assessment: Report given to RN and Post -op Vital signs reviewed and stable  Post vital signs: Reviewed and stable  Last Vitals:  Vitals:   01/03/17 1309  BP: (!) 146/98  Pulse: 70  Resp: 16  Temp: 36.7 C    Last Pain:  Vitals:   01/03/17 1309  TempSrc: Oral      Patients Stated Pain Goal: 3 ([id number])  Complications: No apparent anesthesia complications

## 2017-01-05 ENCOUNTER — Encounter (HOSPITAL_COMMUNITY): Payer: Self-pay | Admitting: Obstetrics & Gynecology

## 2017-03-26 ENCOUNTER — Encounter: Payer: Self-pay | Admitting: Obstetrics & Gynecology

## 2017-03-26 ENCOUNTER — Ambulatory Visit (INDEPENDENT_AMBULATORY_CARE_PROVIDER_SITE_OTHER): Payer: BLUE CROSS/BLUE SHIELD | Admitting: Obstetrics & Gynecology

## 2017-03-26 VITALS — BP 154/92

## 2017-03-26 DIAGNOSIS — N8502 Endometrial intraepithelial neoplasia [EIN]: Secondary | ICD-10-CM

## 2017-03-26 DIAGNOSIS — Z09 Encounter for follow-up examination after completed treatment for conditions other than malignant neoplasm: Secondary | ICD-10-CM | POA: Diagnosis not present

## 2017-03-26 NOTE — Patient Instructions (Signed)
Will transfer Med Records from [employer].  F/U 3 wks for Preop Robotic TLH/Bilateral Salpingectomy.

## 2017-03-26 NOTE — Progress Notes (Signed)
    Kristine Madden [birth year] [id number]        51 y.o.    1. Follow-up examination following surgery:  HSC/Myosure/D+C 12/2016 for Menometro  2. 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  On Prometrium 200 mg qd.   Past medical history,surgical history, problem list, medications, allergies, family history and social history were all reviewed and documented in the EPIC chart.    Patient with Hep C.  H/O Pancreatic Ca with 1/2 pancreas removed and Splenectomy.  Directed ROS with pertinent positives and negatives documented in the history of present illness/assessment and plan.  Exam:  There were no vitals filed for this visit. General appearance:  Normal  Gyn exam:  Normal anteverted uterus, no adnexal mass, no tenderness.  No vaginal bleeding.  Patho:  01/03/2017 Endometrium, curettage, with resection of polyp - SIMPLE AND COMPLEX HYPERPLASIA WITH FOCI OF ATYPIA. - BENIGN ENDOCERVICAL MUCOSA AND BENIGN MYOMETRIUM - SEE MICROSCOPIC DESCRIPTION   Assessment/Plan:  51 y.o.   1. Follow-up examination following surgery:  Good post op evolution, no Cx.   2. Complex atypical endometrial hyperplasia:  Dx discussed, informed that it is a pre-cancerous lesion.  Risk of progression to Cancer reviewed.  Management options and counseling done.  Decision to proceed with Robotic TLH/Bilateral Salpingectomy.  Suspension using the UteroSacral ligaments discussed because of patient's concern of long term descent.  Will continue on Prometrium PO.  F/U Preop in 3 wks.  Counseling on above >50% x 25 min.   Kristine Bruins MD, 2:20 PM 03/26/2017

## 2017-04-03 ENCOUNTER — Encounter: Payer: Self-pay | Admitting: Family Medicine

## 2017-04-03 ENCOUNTER — Ambulatory Visit (INDEPENDENT_AMBULATORY_CARE_PROVIDER_SITE_OTHER): Payer: BLUE CROSS/BLUE SHIELD | Admitting: Family Medicine

## 2017-04-03 VITALS — BP 148/90 | HR 80 | Temp 97.9°F | Wt 138.4 lb

## 2017-04-03 DIAGNOSIS — L03012 Cellulitis of left finger: Secondary | ICD-10-CM | POA: Diagnosis not present

## 2017-04-03 DIAGNOSIS — I10 Essential (primary) hypertension: Secondary | ICD-10-CM | POA: Diagnosis not present

## 2017-04-03 MED ORDER — SULFAMETHOXAZOLE-TRIMETHOPRIM 800-160 MG PO TABS: 1.0000 | ORAL_TABLET | Freq: Two times a day (BID) | ORAL | 0 refills | Status: AC

## 2017-04-03 NOTE — Progress Notes (Signed)
Subjective:    Patient ID: Kristine Madden, female    DOB: [birth year], 51 y.o.   MRN: [id number]  HPI  Kristine Madden is a 51 year old female who presents today with redness and swelling of her left 4th finger. Associated pain and warmth are noted.  Started 2 weeks ago and was improving with epsom salt soaks but did not clear completely. Today, she reports that it is worsening.  History of splenectomy and history of hepatitis C which was diagnosed in 1999.   She denies fever, chills, sweats, drainage, N/V/D. Additional treatment includes topical antibiotic that has provided limited benefit. Trigger noted as a tear of her cuticle.   Elevated blood pressure today:  Retake of BP today is 148/90. She does not monitor her BP at home; She denies denies chest pain, palpitations, SOB, numbness, tingling, weakness, headaches, or edema. No prior history of BP medications. She does not follow a particular diet but reports watching her salt intake.     Review of Systems  Constitutional: Negative for chills, fatigue and fever.  Respiratory: Negative for cough, shortness of breath and wheezing.   Cardiovascular: Negative for chest pain and palpitations.  Gastrointestinal: Negative for abdominal pain, diarrhea, nausea and vomiting.  Musculoskeletal: Negative for myalgias.  Skin:       Redness and swelling of 4th left finger  Neurological: Negative for dizziness, weakness, light-headedness and headaches.   Past Medical History:  Diagnosis Date  . Arthritis   . Cancer [facility]) 2010   islet cell tumor  . Hepatitis C   . Hyperlipidemia   . Pericarditis 09/2016     Social History   Social History  . Marital status: Married    Spouse name: N/A  . Number of children: 1  . Years of education: 37   Occupational History  . Software Musician    Social History Main Topics  . Smoking status: Current Every Day Smoker    Packs/day: 0.50    Years: 30.00    Types: Cigarettes  . Smokeless  tobacco: Never Used  . Alcohol use 1.8 - 2.4 oz/week    3 - 4 Glasses of wine per week  . Drug use: No  . Sexual activity: Not on file   Other Topics Concern  . Not on file   Social History Narrative   Fun: Yoga, Armand[place] Gemma and dogs.    Denies abuse and feels safe at home.     Past Surgical History:  Procedure Laterality Date  . DILATATION & CURETTAGE/HYSTEROSCOPY WITH MYOSURE N/A 01/03/2017   Procedure: [place];  Surgeon: [name], MD;  Location: [facility];  Service: Gynecology;  Laterality: N/A;  . EYE SURGERY    . HEMORRHOID SURGERY    . KNEE ARTHROSCOPY    . 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 SPLENECTOMY, TOTAL    . TONSILLECTOMY    . TUBAL LIGATION      Family History  Problem Relation Age of Onset  . Arthritis Mother   . Hyperlipidemia Mother   . Hypertension Father     Allergies  Allergen Reactions  . Hydrocodone Nausea And Vomiting  . Penicillins Hives    Has patient had a PCN reaction causing immediate rash, facial/tongue/throat swelling, SOB or lightheadedness with hypotension: No Has patient had a PCN reaction causing severe rash involving mucus membranes or skin necrosis: No Has patient had a PCN reaction that required hospitalization No Has patient had a  PCN reaction occurring within the last 10 years: No If all of the above answers are "NO", then may proceed with Cephalosporin use.     Current Outpatient Prescriptions on File Prior to Visit  Medication Sig Dispense Refill  . ibuprofen (ADVIL,MOTRIN) 200 MG tablet Take 400 mg by mouth every 6 (six) hours as needed for headache or moderate pain.    Marland Kitchen oxyCODONE-acetaminophen (PERCOCET) 7.5-325 MG tablet Take 1 tablet by mouth every 4 (four) hours as needed for severe pain. 4 tablet 0  . progesterone (PROMETRIUM) 200 MG capsule Take 200 mg by mouth daily.     No current facility-administered medications on file prior to visit.     BP (!) 148/90    Pulse 80   Temp 97.9 F (36.6 C) (Oral)   Wt 138 lb 6.4 oz (62.8 kg)   SpO2 98%   BMI 21.68 kg/m        Objective:   Physical Exam  Constitutional: She is oriented to person, place, and time. She appears well-developed and well-nourished.  Eyes: Pupils are equal, round, and reactive to light. No scleral icterus.  Neck: Neck supple.  Cardiovascular: Normal rate and regular rhythm.   Pulmonary/Chest: Effort normal and breath sounds normal. She has no wheezes. She has no rales.  Lymphadenopathy:    She has no cervical adenopathy.  Neurological: She is alert and oriented to person, place, and time.  Skin: Skin is warm and dry. No rash noted.  Left 4th digit demonstrates erythema with mild swelling around cuticle. No evidence of red streaks or fluctuance appreciated.          Assessment & Plan:  1. Cellulitis of finger of left hand Symptoms that were improving but now worsening support treatment of cellulitis. Advised warm compresses and follow up with PCP if symptoms do not improve in 3 to 4 days, worsen, or she develops a fever >101. - sulfamethoxazole-trimethoprim (BACTRIM DS,SEPTRA DS) 800-160 MG tablet; Take 1 tablet by mouth 2 (two) times daily.  Dispense: 10 tablet; Refill: 0  2. Elevated blood pressure reading in office with diagnosis of hypertension Retake of BP improved but remains elevated with diastolic at the borderline range. Reviewed importance of monitoring BP and provided parameters for reporting. Advised monitoring for 2 weeks and bring cuff with documented readings for evaluation with her PCP. Advised DASH diet and smoking cessation. She is motivated with dietary changes and will consider smoking cessation methods with her PCP in 2 weeks. She may be interested in Chantix.  Delano Metz, FNP-C

## 2017-04-03 NOTE — Patient Instructions (Addendum)
Please take medication as directed and follow up if symptoms do not improve in 2 to 3 days, worsen, or you develop a fever >101 or red streaks from the area. Also, please monitor blood pressure for 2 weeks and bring readings and cuff with you to follow up with your provider.  Minimal Blood Pressure Goal= AVERAGE < 140/90; Ideal is an AVERAGE < 135/85. This AVERAGE should be calculated from @ least 5-7 BP readings taken @ different times of day on different days of week. You should not respond to isolated BP readings , but rather the AVERAGE for that week .Please bring your blood pressure cuff to office visits to verify that it is reliable.It can also be checked against the blood pressure device at the pharmacy. Finger or wrist cuffs are not dependable; an arm cuff is.  See dietary recommendations that can improve your blood pressure reading and consider decreasing and stopping cigarettes as we discussed.   DASH Eating Plan DASH stands for "Dietary Approaches to Stop Hypertension." The DASH eating plan is a healthy eating plan that has been shown to reduce high blood pressure (hypertension). It may also reduce your risk for type 2 diabetes, heart disease, and stroke. The DASH eating plan may also help with weight loss. What are tips for following this plan? General guidelines   Avoid eating more than 2,300 mg (milligrams) of salt (sodium) a day. If you have hypertension, you may need to reduce your sodium intake to 1,500 mg a day.  Limit alcohol intake to no more than 1 drink a day for nonpregnant women and 2 drinks a day for men. One drink equals 12 oz of beer, 5 oz of wine, or 1 oz of hard liquor.  Work with your health care provider to maintain a healthy body weight or to lose weight. Ask what an ideal weight is for you.  Get at least 30 minutes of exercise that causes your heart to beat faster (aerobic exercise) most days of the week. Activities may include walking, swimming, or  biking.  Work with your health care provider or diet and nutrition specialist (dietitian) to adjust your eating plan to your individual calorie needs. Reading food labels   Check food labels for the amount of sodium per serving. Choose foods with less than 5 percent of the Daily Value of sodium. Generally, foods with less than 300 mg of sodium per serving fit into this eating plan.  To find whole grains, look for the word "whole" as the first word in the ingredient list. Shopping   Buy products labeled as "low-sodium" or "no salt added."  Buy fresh foods. Avoid canned foods and premade or frozen meals. Cooking   Avoid adding salt when cooking. Use salt-free seasonings or herbs instead of table salt or sea salt. Check with your health care provider or pharmacist before using salt substitutes.  Do not fry foods. Cook foods using healthy methods such as baking, boiling, grilling, and broiling instead.  Cook with heart-healthy oils, such as olive, canola, soybean, or sunflower oil. Meal planning    Eat a balanced diet that includes:  5 or more servings of fruits and vegetables each day. At each meal, try to fill half of your plate with fruits and vegetables.  Up to 6-8 servings of whole grains each day.  Less than 6 oz of lean meat, poultry, or fish each day. A 3-oz serving of meat is about the same size as a deck of cards. One egg  equals 1 oz.  2 servings of low-fat dairy each day.  A serving of nuts, seeds, or beans 5 times each week.  Heart-healthy fats. Healthy fats called Omega-3 fatty acids are found in foods such as flaxseeds and coldwater fish, like sardines, salmon, and mackerel.  Limit how much you eat of the following:  Canned or prepackaged foods.  Food that is high in trans fat, such as fried foods.  Food that is high in saturated fat, such as fatty meat.  Sweets, desserts, sugary drinks, and other foods with added sugar.  Full-fat dairy products.  Do not  salt foods before eating.  Try to eat at least 2 vegetarian meals each week.  Eat more home-cooked food and less restaurant, buffet, and fast food.  When eating at a restaurant, ask that your food be prepared with less salt or no salt, if possible. What foods are recommended? The items listed may not be a complete list. Talk with your dietitian about what dietary choices are best for you. Grains  Whole-grain or whole-wheat bread. Whole-grain or whole-wheat pasta. Brown rice. [place]row. Bulgur. Whole-grain and low-sodium cereals. Pita bread. Low-fat, low-sodium crackers. Whole-wheat flour tortillas. Vegetables  Fresh or frozen vegetables (raw, steamed, roasted, or grilled). Low-sodium or reduced-sodium tomato and vegetable juice. Low-sodium or reduced-sodium tomato sauce and tomato paste. Low-sodium or reduced-sodium canned vegetables. Fruits  All fresh, dried, or frozen fruit. Canned fruit in natural juice (without added sugar). Meat and other protein foods  Skinless chicken or [place]. Ground chicken or [place]. Pork with fat trimmed off. Fish and seafood. Egg whites. Dried beans, peas, or lentils. Unsalted nuts, nut butters, and seeds. Unsalted canned beans. Lean cuts of beef with fat trimmed off. Low-sodium, lean deli meat. Dairy  Low-fat (1%) or fat-free (skim) milk. Fat-free, low-fat, or reduced-fat cheeses. Nonfat, low-sodium ricotta or cottage cheese. Low-fat or nonfat yogurt. Low-fat, low-sodium cheese. Fats and oils  Soft margarine without trans fats. Vegetable oil. Low-fat, reduced-fat, or light mayonnaise and salad dressings (reduced-sodium). Canola, safflower, olive, soybean, and sunflower oils. Avocado. Seasoning and other foods  Herbs. Spices. Seasoning mixes without salt. Unsalted popcorn and pretzels. Fat-free sweets. What foods are not recommended? The items listed may not be a complete list. Talk with your dietitian about what dietary choices are best for you. Grains   Baked goods made with fat, such as croissants, muffins, or some breads. Dry pasta or rice meal packs. Vegetables  Creamed or fried vegetables. Vegetables in a cheese sauce. Regular canned vegetables (not low-sodium or reduced-sodium). Regular canned tomato sauce and paste (not low-sodium or reduced-sodium). Regular tomato and vegetable juice (not low-sodium or reduced-sodium). Angie Fava. Olives. Fruits  Canned fruit in a light or heavy syrup. Fried fruit. Fruit in cream or butter sauce. Meat and other protein foods  Fatty cuts of meat. Ribs. Fried meat. Berniece Salines. Sausage. Bologna and other processed lunch meats. Salami. Fatback. Hotdogs. Bratwurst. Salted nuts and seeds. Canned beans with added salt. Canned or smoked fish. Whole eggs or egg yolks. Chicken or [place] with skin. Dairy  Whole or 2% milk, cream, and half-and-half. Whole or full-fat cream cheese. Whole-fat or sweetened yogurt. Full-fat cheese. Nondairy creamers. Whipped toppings. Processed cheese and cheese spreads. Fats and oils  Butter. Stick margarine. Lard. Shortening. Ghee. Bacon fat. Tropical oils, such as coconut, palm kernel, or palm oil. Seasoning and other foods  Salted popcorn and pretzels. Onion salt, garlic salt, seasoned salt, table salt, and sea salt. Worcestershire sauce. Tartar sauce. Barbecue sauce.  Teriyaki sauce. Soy sauce, including reduced-sodium. Steak sauce. Canned and packaged gravies. Fish sauce. Oyster sauce. Cocktail sauce. Horseradish that you find on the shelf. Ketchup. Mustard. Meat flavorings and tenderizers. Bouillon cubes. Hot sauce and Tabasco sauce. Premade or packaged marinades. Premade or packaged taco seasonings. Relishes. Regular salad dressings. Where to find more information:  National Heart, Lung, and [place]: https://wilson-eaton.com/  American Heart Association: www.heart.org Summary  The DASH eating plan is a healthy eating plan that has been shown to reduce high blood pressure (hypertension).  It may also reduce your risk for type 2 diabetes, heart disease, and stroke.  With the DASH eating plan, you should limit salt (sodium) intake to 2,300 mg a day. If you have hypertension, you may need to reduce your sodium intake to 1,500 mg a day.  When on the DASH eating plan, aim to eat more fresh fruits and vegetables, whole grains, lean proteins, low-fat dairy, and heart-healthy fats.  Work with your health care provider or diet and nutrition specialist (dietitian) to adjust your eating plan to your individual calorie needs. This information is not intended to replace advice given to you by your health care provider. Make sure you discuss any questions you have with your health care provider. Document Released: 11/28/2011 Document Revised: 12/02/2016 Document Reviewed: 12/02/2016 Elsevier Interactive Patient Education  2017 [place] NOW OFFER   [facility] FAST TRACK!!!  SAME DAY Appointments for ACUTE CARE  Such as: Sprains, Injuries, cuts, abrasions, rashes, muscle pain, joint pain, back pain Colds, flu, sore throats, headache, allergies, cough, fever  Ear pain, sinus and eye infections Abdominal pain, nausea, vomiting, diarrhea, upset stomach Animal/insect bites  3 Easy Ways to Schedule: Walk-In Scheduling Call in scheduling Mychart Sign-up: [email or web address]

## 2017-04-03 NOTE — Progress Notes (Signed)
Pre visit review using our clinic review tool, if applicable. No additional management support is needed unless otherwise documented below in the visit note. 

## 2017-04-16 ENCOUNTER — Ambulatory Visit (INDEPENDENT_AMBULATORY_CARE_PROVIDER_SITE_OTHER): Payer: BLUE CROSS/BLUE SHIELD | Admitting: Obstetrics & Gynecology

## 2017-04-16 ENCOUNTER — Encounter: Payer: Self-pay | Admitting: Obstetrics & Gynecology

## 2017-04-16 VITALS — BP 130/80

## 2017-04-16 DIAGNOSIS — Z01818 Encounter for other preprocedural examination: Secondary | ICD-10-CM | POA: Diagnosis not present

## 2017-04-16 DIAGNOSIS — N8502 Endometrial intraepithelial neoplasia [EIN]: Secondary | ICD-10-CM

## 2017-04-16 NOTE — Patient Instructions (Signed)
A Pre-Operative visit was done today.  You will find the information on the surgery we are scheduling below.  We will organize the surgery and call you with date and time options as soon as possible.    Robotic Total Laparoscopic Hysterectomy/Bilateral Salpingectomy/Utero-sacral ligament suspension A total laparoscopic hysterectomy is a minimally invasive surgery to remove your uterus and cervix. This surgery is performed by making several small cuts (incisions) in your abdomen. It can also be done with a thin, lighted tube (laparoscope) inserted into two small incisions in your lower abdomen. Your fallopian tubes and ovaries can be removed (bilateral salpingo-oophorectomy) during this surgery as well.Benefits of minimally invasive surgery include:  Less pain.  Less risk of blood loss.  Less risk of infection.  Quicker return to normal activities. Tell a health care provider about:  Any allergies you have.  All medicines you are taking, including vitamins, herbs, eye drops, creams, and over-the-counter medicines.  Any problems you or family members have had with anesthetic medicines.  Any blood disorders you have.  Any surgeries you have had.  Any medical conditions you have. What are the risks? Generally, this is a safe procedure. However, as with any procedure, complications can occur. Possible complications include:  Bleeding.  Blood clots in the legs or lung.  Infection.  Injury to surrounding organs.  Problems with anesthesia.  Early menopause symptoms (hot flashes, night sweats, insomnia).  Risk of conversion to an open abdominal incision. What happens before the procedure?  Ask your health care provider about changing or stopping your regular medicines.  Do not take aspirin or blood thinners (anticoagulants) for 1 week before the surgery or as told by your health care provider.  Do not eat or drink anything for 8 hours before the surgery or as told by your  health care provider.  Quit smoking if you smoke.  Arrange for a ride home after surgery and for someone to help you at home during recovery. What happens during the procedure?  You will be given antibiotic medicine.  An IV tube will be placed in your arm. You will be given medicine to make you sleep (general anesthetic).  A gas (carbon dioxide) will be used to inflate your abdomen. This will allow your surgeon to look inside your abdomen, perform your surgery, and treat any other problems found if necessary.  Three or four small incisions (often less than 1/2 inch) will be made in your abdomen. One of these incisions will be made in the area of your belly button (navel). The laparoscope will be inserted into the incision. Your surgeon will look through the laparoscope while doing your procedure.  Other surgical instruments will be inserted through the other incisions.  Your uterus may be removed through your vagina or cut into small pieces and removed through the small incisions.  Your incisions will be closed. What happens after the procedure?  The gas will be released from inside your abdomen.  You will be taken to the recovery area where a nurse will watch and check your progress. Once you are awake, stable, and taking fluids well, without other problems, you will return to your room or be allowed to go home.  There is usually minimal discomfort following the surgery because the incisions are so small.  You will be given pain medicine while you are in the hospital and for when you go home. This information is not intended to replace advice given to you by your health care provider. Make  sure you discuss any questions you have with your health care provider. Document Released: 10/06/2007 Document Revised: 05/16/2016 Document Reviewed: 06/29/2013 Elsevier Interactive Patient Education  2017 [employer].

## 2017-04-16 NOTE — Progress Notes (Signed)
     Kristine Madden 12/27/1965 [id number]        51 y.o.  Pre-op visit for Robotic TLH/Bilat Salpingectomy/US ligament suspension  Patho Dx:  Complex Atypical Endometrial Hyperplasia   Past medical history,surgical history, problem list, medications, allergies, family history and social history were all reviewed and documented in the EPIC chart.  Directed ROS with pertinent positives and negatives documented in the history of present illness/assessment and plan.  Exam:  Vitals:   04/16/17 1623  BP: 130/80   General appearance:  Normal   Assessment/Plan:  51 y.o.   1. Pre-operative examination  Complex atypical endometrial hyperplasia:  Dx discussed, informed that it is a pre-cancerous lesion.  Risk of progression to Cancer reviewed.  Management options and counseling done.  Decision to proceed with Robotic TLH/Bilateral Salpingectomy.  Suspension using the UteroSacral ligaments discussed because of patient's concern of long term descent.                Patient was counseled as to the risk of surgery to include the following:  1. Infection (prohylactic antibiotics will be administered)  2. DVT/Pulmonary Embolism (prophylactic pneumo compression stockings will be used)  3.Trauma to internal organs requiring additional surgical procedure to repair any injury to     Internal organs requiring perhaps additional hospitalization days.  4.Hemmorhage requiring transfusion and blood products which carry risks such as anaphylactic reaction, hepatitis and AIDS  Patient had received literature information on the procedure scheduled and all her questions were answered and fully accepts all risk.   [place] [id number] PMTD

## 2017-05-07 LAB — HM MAMMOGRAPHY

## 2017-05-08 ENCOUNTER — Encounter: Payer: Self-pay | Admitting: Family

## 2017-05-08 ENCOUNTER — Telehealth: Payer: Self-pay

## 2017-05-08 NOTE — Telephone Encounter (Signed)
Surgery order:  Dx:  Complex Endometrial Hyperplasia with Atypia  Robotic TLH/Bilat Salpingectomy/US ligament suspension  2 hours  RNFA assistant  [facility]

## 2017-05-08 NOTE — Telephone Encounter (Signed)
Patient called. She said she saw you 4/25 and was to schedule hysterectomy. Please send me your surgery order. Thanks

## 2017-05-09 ENCOUNTER — Encounter: Payer: Self-pay | Admitting: Family

## 2017-05-12 NOTE — Telephone Encounter (Signed)
I spoke with patient and offered her surgery date 05/20/17.  Patient said too short of notice.  I explained the next date was 06/27/17/ She declined that date because she has a wedding the next day.  I called and spoke with Chassity in the OR and she moved a case out of Room 7 to allow [place] to scheduled 12:30pm on 06/17/17. I called the patient and she said this was ideal for her and the week she was hoping to schedule.  She will expect a call from [facility] with all her instructions.  We discussed her ins benefits and her estimated surgery prepymt due by one week before surgery.  Financial letter will be mailed to her as well.

## 2017-05-15 ENCOUNTER — Emergency Department
Admission: EM | Admit: 2017-05-15 | Discharge: 2017-05-15 | Disposition: A | Discharge status: Home / Self Care | Payer: BLUE CROSS/BLUE SHIELD | Attending: Emergency Medicine | Admitting: Emergency Medicine

## 2017-05-15 DIAGNOSIS — N764 Abscess of vulva: Secondary | ICD-10-CM | POA: Diagnosis present

## 2017-05-15 DIAGNOSIS — F17210 Nicotine dependence, cigarettes, uncomplicated: Secondary | ICD-10-CM | POA: Insufficient documentation

## 2017-05-15 DIAGNOSIS — N750 Cyst of Bartholin's gland: Secondary | ICD-10-CM | POA: Insufficient documentation

## 2017-05-15 DIAGNOSIS — C254 Malignant neoplasm of endocrine pancreas: Secondary | ICD-10-CM | POA: Insufficient documentation

## 2017-05-15 DIAGNOSIS — Z79899 Other long term (current) drug therapy: Secondary | ICD-10-CM | POA: Diagnosis not present

## 2017-05-15 MED ORDER — SULFAMETHOXAZOLE-TRIMETHOPRIM 800-160 MG PO TABS: 1.0000 | ORAL_TABLET | Freq: Two times a day (BID) | ORAL | 0 refills | Status: DC

## 2017-05-15 NOTE — ED Triage Notes (Signed)
Pt in with co abscess to right labia, she noted it today. Hx of the same years ago and was bartholin cyst.

## 2017-05-15 NOTE — ED Notes (Signed)

## 2017-05-15 NOTE — ED Notes (Signed)
Pt presents to ED c/o cyst to right labia, pt noted it today. Hx of the same years ago.

## 2017-05-16 NOTE — ED Provider Notes (Signed)
[facility] Emergency Department Provider Note  ____________________________________________  Time seen: Approximately 3:36 PM  I have reviewed the triage vital signs and the nursing notes.   HISTORY  Chief Complaint Abscess    HPI Kristine Madden is a 51 y.o. female that presents to the emergency department with concerns for a labial abscess. This morning she felt a tender knot on the right side of her labia. She has Memorial Day weekend plans and is hoping that it wont get worse while she is on vacation. She had a Bartholian abscess when she was 30 and was placed on antibiotics until the abscess eventually popped on its own. Symptoms feel similar to previous abscess. She denies fever, shortness breath, chest pain, nausea, vomiting, abdominal pain, dysuria, urgency, frequency, vaginal discharge.   Past Medical History:  Diagnosis Date  . Arthritis   . 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 Hepatitis C   . Hyperlipidemia   . Pericarditis 09/2016    Patient Active Problem List   Diagnosis Date Noted  . Exposure to STD 09/04/2016  . Pleuritic chest pain 09/04/2016  . Hepatitis C 09/04/2016  . Abscess of skin and subcutaneous tissue 08/17/2013    Past Surgical History:  Procedure Laterality Date  . DILATATION & CURETTAGE/HYSTEROSCOPY WITH MYOSURE N/A 01/03/2017   Procedure: [place];  Surgeon: [name], MD;  Location: [facility];  Service: Gynecology;  Laterality: N/A;  . EYE SURGERY    . HEMORRHOID SURGERY    . KNEE ARTHROSCOPY    . 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 SPLENECTOMY, TOTAL    . TONSILLECTOMY    . TUBAL LIGATION      Prior to Admission medications   Medication Sig Start Date End Date Taking? Authorizing Provider  ibuprofen (ADVIL,MOTRIN) 200 MG tablet Take 400 mg by mouth every 6 (six) hours as needed for headache or moderate pain.    [provider]   oxyCODONE-acetaminophen (PERCOCET) 7.5-325 MG tablet Take 1 tablet by mouth every 4 (four) hours as needed for severe pain. Patient not taking: Reported on 04/16/2017 01/03/17   [name], MD  progesterone (PROMETRIUM) 200 MG capsule Take 200 mg by mouth daily.    [provider]  sulfamethoxazole-trimethoprim (BACTRIM DS,SEPTRA DS) 800-160 MG tablet Take 1 tablet by mouth 2 (two) times daily. 05/15/17   Laban Emperor, PA-C    Allergies Hydrocodone and Penicillins  Family History  Problem Relation Age of Onset  . Arthritis Mother   . Hyperlipidemia Mother   . Hypertension Father     Social History Social History  Substance Use Topics  . Smoking status: Current Every Day Smoker    Packs/day: 0.50    Years: 30.00    Types: Cigarettes  . Smokeless tobacco: Never Used  . Alcohol use 1.8 - 2.4 oz/week    3 - 4 Glasses of wine per week     Review of Systems  Constitutional: No fever/chills Cardiovascular: No chest pain. Respiratory: No SOB. Gastrointestinal: No abdominal pain.  No nausea, no vomiting.  *Genitourinary: Negative for dysuria. Musculoskeletal: Negative for musculoskeletal pain. Skin: Negative for rash, abrasions, lacerations, ecchymosis. Neurological: Negative for headaches   ____________________________________________   PHYSICAL EXAM:  VITAL SIGNS: ED Triage Vitals [05/15/17 1944]  Enc Vitals Group     BP (!) 161/106     Pulse Rate 96     Resp 18     Temp 98.6 F (  37 C)     Temp Source Oral     SpO2 98 %     Weight 140 lb (63.5 kg)     Height 5\' 7"  (1.702 m)     Head Circumference      Peak Flow      Pain Score 4     Pain Loc      Pain Edu?      Excl. in [place]?      Constitutional: Alert and oriented. Well appearing and in no acute distress. Eyes: Conjunctivae are normal. PERRL. EOMI. Head: Atraumatic. ENT:      Ears:      Nose: No congestion/rhinnorhea.      Mouth/Throat: Mucous membranes are moist.  Neck: No stridor.   Cardiovascular: Normal rate, regular rhythm.  Good peripheral circulation. Respiratory: Normal respiratory effort without tachypnea or retractions. Lungs CTAB. Good air entry to the bases with no decreased or absent breath sounds. Gastrointestinal: Bowel sounds 4 quadrants. Soft and nontender to palpation. No guarding or rigidity. No palpable masses. No distention.  Musculoskeletal: Full range of motion to all extremities. No gross deformities appreciated. Genitourinary: RN present for external pelvic exam. 1/2 cm area of mild tenderness and swelling on right labia. No areas of fluctuance. No rash. Neurologic:  Normal speech and language. No gross focal neurologic deficits are appreciated.  Skin:  Skin is warm, dry and intact. No rash noted.  ____________________________________________   LABS (all labs ordered are listed, but only abnormal results are displayed)  Labs Reviewed - No data to display ____________________________________________  EKG   ____________________________________________  RADIOLOGY  No results found.  ____________________________________________    PROCEDURES  Procedure(s) performed:    Procedures    Medications - No data to display   ____________________________________________   INITIAL IMPRESSION / ASSESSMENT AND PLAN / ED COURSE  Pertinent labs & imaging results that were available during my care of the patient were reviewed by me and considered in my medical decision making (see chart for details).  Review of the [place] CSRS was performed in accordance of the [place] prior to dispensing any controlled drugs.  Patient's diagnosis is consistent with bartholin cyst. Vital signs and exam are reassuring. I do not feel this is drainable currently. Patient will be discharged home with prescriptions for Bactrim. Patient is to follow up with gyn as directed. Patient is given ED precautions to return to the ED for any worsening or new  symptoms.     ____________________________________________  FINAL CLINICAL IMPRESSION(S) / ED DIAGNOSES  Final diagnoses:  Bartholin cyst      NEW MEDICATIONS STARTED DURING THIS VISIT:  Discharge Medication List as of 05/15/2017  9:15 PM    START taking these medications   Details  sulfamethoxazole-trimethoprim (BACTRIM DS,SEPTRA DS) 800-160 MG tablet Take 1 tablet by mouth 2 (two) times daily., Starting Thu 05/15/2017, Print            This chart was dictated using voice recognition software/Dragon. Despite best efforts to proofread, errors can occur which can change the meaning. Any change was purely unintentional.    Laban Emperor, PA-C 05/16/17 1845    Earleen Newport, MD 05/16/17 2224

## 2017-05-27 ENCOUNTER — Encounter: Payer: Self-pay | Admitting: Family

## 2017-05-27 ENCOUNTER — Ambulatory Visit (INDEPENDENT_AMBULATORY_CARE_PROVIDER_SITE_OTHER): Payer: BLUE CROSS/BLUE SHIELD | Admitting: Family

## 2017-05-27 VITALS — BP 128/84 | HR 78 | Temp 98.8°F | Resp 16 | Ht 67.0 in | Wt 141.8 lb

## 2017-05-27 DIAGNOSIS — Z720 Tobacco use: Secondary | ICD-10-CM | POA: Diagnosis not present

## 2017-05-27 DIAGNOSIS — Z0001 Encounter for general adult medical examination with abnormal findings: Secondary | ICD-10-CM | POA: Insufficient documentation

## 2017-05-27 DIAGNOSIS — F32A Depression, unspecified: Secondary | ICD-10-CM | POA: Insufficient documentation

## 2017-05-27 DIAGNOSIS — F330 Major depressive disorder, recurrent, mild: Secondary | ICD-10-CM

## 2017-05-27 DIAGNOSIS — F329 Major depressive disorder, single episode, unspecified: Secondary | ICD-10-CM | POA: Insufficient documentation

## 2017-05-27 MED ORDER — BUPROPION HCL ER (SR) 150 MG PO TB12: ORAL_TABLET | ORAL | 2 refills | Status: DC

## 2017-05-27 NOTE — Assessment & Plan Note (Signed)
1) Anticipatory Guidance: Discussed importance of wearing a seatbelt while driving and not texting while driving; changing batteries in smoke detector at least once annually; wearing suntan lotion when outside; eating a balanced and moderate diet; getting physical activity at least 30 minutes per day.  2) Immunizations / Screenings / Labs:  Declines tetanus. All other immunizations are up-to-date per recommendations. Due for a vision exam encouraged to be completed independently. Cervical cancer and breast cancer screenings are up-to-date per recommendations. Cleanse colonoscopy secondary to upcoming hysterectomy. Hepatitis C screening completed. All other screenings are up-to-date per recommendations. Obtain CBC, CMET, and lipid profile.    Overall well exam with risk factors for cardiovascular disease including tobacco use. Discussed importance of tobacco cessation to reduce risk of end organ damage in the future. She is of good weight and exercises regularly. Continues to struggle with menopause and hormonal related symptoms. Continue other healthy lifestyle behaviors and choices. Follow-up prevention exam in 1 year. Follow-up office visit pending blood work and for chronic conditions.

## 2017-05-27 NOTE — Progress Notes (Signed)
Subjective:    Patient ID: Kristine Madden, female    DOB: July 22, 1966, 51 y.o.   MRN: [id number] [facility]  Chief Complaint  Patient presents with  . CPE    not fasting    HPI:  Kristine Madden is a 51 y.o. female who presents today for an annual wellness visit.   1) Health Maintenance -   Diet - Averages about 2 meals per day consisting of a regular diet; Caffeine intake of about 4-5 cups per day.   Exercise - 4x per week; resistance and cardio   2) Preventative Exams / Immunizations:  Dental -- Up to date  Vision -- Due for exam   Health Maintenance  Topic Date Due  . HIV Screening  10/12/1979  . TETANUS/TDAP  10/12/1983  . COLONOSCOPY  10/11/2014  . INFLUENZA VACCINE  07/23/2017  . PAP SMEAR  06/22/2018  . MAMMOGRAM  05/08/2019  . Hepatitis C Screening  Completed     There is no immunization history on file for this patient.  3.) Depression - This is a new problem. Stepson died of an accidental drug overdose and has turned her life upside down. She lost of her job of 22 years after following others around to be supportive. Has several job stressors and is not performing a lot of personal hygiene. Scheduled to undergo a hysterectomy. Has been Wellbutrin in the past which she is unsure it helps. BUp  Allergies  Allergen Reactions  . Hydrocodone Nausea And Vomiting  . Penicillins Hives    Has patient had a PCN reaction causing immediate rash, facial/tongue/throat swelling, SOB or lightheadedness with hypotension: No Has patient had a PCN reaction causing severe rash involving mucus membranes or skin necrosis: No Has patient had a PCN reaction that required hospitalization No Has patient had a PCN reaction occurring within the last 10 years: No If all of the above answers are "NO", then may proceed with Cephalosporin use.      Outpatient Medications Prior to Visit  Medication Sig Dispense Refill  . ibuprofen (ADVIL,MOTRIN) 200 MG tablet Take 400 mg by mouth  every 6 (six) hours as needed for headache or moderate pain.    Marland Kitchen sulfamethoxazole-trimethoprim (BACTRIM DS,SEPTRA DS) 800-160 MG tablet Take 1 tablet by mouth 2 (two) times daily. 20 tablet 0  . progesterone (PROMETRIUM) 200 MG capsule Take 200 mg by mouth daily.    Marland Kitchen oxyCODONE-acetaminophen (PERCOCET) 7.5-325 MG tablet Take 1 tablet by mouth every 4 (four) hours as needed for severe pain. (Patient not taking: Reported on 04/16/2017) 4 tablet 0   No facility-administered medications prior to visit.      Past Medical History:  Diagnosis Date  . Arthritis   . 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 Hepatitis C   . Hyperlipidemia   . Pericarditis 09/2016     Past Surgical History:  Procedure Laterality Date  . DILATATION & CURETTAGE/HYSTEROSCOPY WITH MYOSURE N/A 01/03/2017   Procedure: [place];  Surgeon: [name], MD;  Location: [facility];  Service: Gynecology;  Laterality: N/A;  . EYE SURGERY    . HEMORRHOID SURGERY    . KNEE ARTHROSCOPY    . 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 SPLENECTOMY, TOTAL    . TONSILLECTOMY    . TUBAL LIGATION       Family History  Problem Relation Age of Onset  . Arthritis Mother   . Hyperlipidemia Mother   .  Hypertension Father      Social History   Social History  . Marital status: Married    Spouse name: N/A  . Number of children: 1  . Years of education: 75   Occupational History  . Software Musician    Social History Main Topics  . Smoking status: Current Every Day Smoker    Packs/day: 0.50    Years: 30.00    Types: Cigarettes  . Smokeless tobacco: Never Used  . Alcohol use 1.8 - 2.4 oz/week    3 - 4 Glasses of wine per week  . Drug use: No  . Sexual activity: Not on file   Other Topics Concern  . Not on file   Social History Narrative   Fun: Yoga, [place]and[place] Gemma and dogs.    Denies abuse and feels safe at home.       Review of Systems  Constitutional: Denies  fever, chills, fatigue, or significant weight gain/loss. HENT: Head: Denies headache or neck pain Ears: Denies changes in hearing, ringing in ears, earache, drainage Nose: Denies discharge, stuffiness, itching, nosebleed, sinus pain Throat: Denies sore throat, hoarseness, dry mouth, sores, thrush Eyes: Denies loss/changes in vision, pain, redness, blurry/double vision, flashing lights Cardiovascular: Denies chest pain/discomfort, tightness, palpitations, shortness of breath with activity, difficulty lying down, swelling, sudden awakening with shortness of breath Respiratory: Denies shortness of breath, cough, sputum production, wheezing Gastrointestinal: Denies dysphasia, heartburn, change in appetite, nausea, change in bowel habits, rectal bleeding, constipation, diarrhea, yellow skin or eyes Genitourinary: Denies frequency, urgency, burning/pain, blood in urine, incontinence, change in urinary strength. Musculoskeletal: Denies muscle/joint pain, stiffness, back pain, redness or swelling of joints, trauma Skin: Denies rashes, lumps, itching, dryness, color changes, or hair/nail changes Neurological: Denies dizziness, fainting, seizures, weakness, numbness, tingling, tremor Psychiatric - Denies nervousness, stress, depression or memory loss Endocrine: Denies heat or cold intolerance, sweating, frequent urination, excessive thirst, changes in appetite Hematologic: Denies ease of bruising or bleeding     Objective:     BP 128/84 (BP Location: Left Arm, Patient Position: Sitting, Cuff Size: Normal)   Pulse 78   Temp 98.8 F (37.1 C) (Oral)   Resp 16   Ht 5\' 7"  (1.702 m)   Wt 141 lb 12.8 oz (64.3 kg)   SpO2 97%   BMI 22.21 kg/m  Nursing note and vital signs reviewed.  Depression screen PHQ 2/9 05/27/2017  Decreased Interest 2  Down, Depressed, Hopeless 1  PHQ - 2 Score 3  Altered sleeping 0  Tired, decreased energy 1  Change in appetite 1  Feeling bad or failure about yourself  0    Trouble concentrating 2  Moving slowly or fidgety/restless 0  Suicidal thoughts 0  PHQ-9 Score 7  Difficult doing work/chores Somewhat difficult    Physical Exam  Constitutional: She is oriented to person, place, and time. She appears well-developed and well-nourished.  HENT:  Head: Normocephalic.  Right Ear: Hearing, tympanic membrane, external ear and ear canal normal.  Left Ear: Hearing, tympanic membrane, external ear and ear canal normal.  Nose: Nose normal.  Mouth/Throat: Uvula is midline, oropharynx is clear and moist and mucous membranes are normal.  Eyes: Conjunctivae and EOM are normal. Pupils are equal, round, and reactive to light.  Neck: Neck supple. No JVD present. No tracheal deviation present. No thyromegaly present.  Cardiovascular: Normal rate, regular rhythm, normal heart sounds and intact distal pulses.   Pulmonary/Chest: Effort normal and breath sounds normal.  Abdominal: Soft. Bowel sounds are normal.  She exhibits no distension and no mass. There is no tenderness. There is no rebound and no guarding.  Musculoskeletal: Normal range of motion. She exhibits no edema or tenderness.  Lymphadenopathy:    She has no cervical adenopathy.  Neurological: She is alert and oriented to person, place, and time. She has normal reflexes. No cranial nerve deficit. She exhibits normal muscle tone. Coordination normal.  Skin: Skin is warm and dry.  Psychiatric: Her behavior is normal. Judgment and thought content normal. Her mood appears anxious. She exhibits a depressed mood.       Assessment & Plan:   Problem List Items Addressed This Visit      Other   Encounter for general adult medical examination with abnormal findings - Primary    1) Anticipatory Guidance: Discussed importance of wearing a seatbelt while driving and not texting while driving; changing batteries in smoke detector at least once annually; wearing suntan lotion when outside; eating a balanced and moderate  diet; getting physical activity at least 30 minutes per day.  2) Immunizations / Screenings / Labs:  Declines tetanus. All other immunizations are up-to-date per recommendations. Due for a vision exam encouraged to be completed independently. Cervical cancer and breast cancer screenings are up-to-date per recommendations. Cleanse colonoscopy secondary to upcoming hysterectomy. Hepatitis C screening completed. All other screenings are up-to-date per recommendations. Obtain CBC, CMET, and lipid profile.    Overall well exam with risk factors for cardiovascular disease including tobacco use. Discussed importance of tobacco cessation to reduce risk of end organ damage in the future. She is of good weight and exercises regularly. Continues to struggle with menopause and hormonal related symptoms. Continue other healthy lifestyle behaviors and choices. Follow-up prevention exam in 1 year. Follow-up office visit pending blood work and for chronic conditions.      Relevant Orders   CBC   Comprehensive metabolic panel   Lipid panel   Hepatitis C RNA quantitative   Depression    PH Q9 score of 7 indicating mild depression with no evidence of psychosis. Several family and work related stressors contributing to her current situation. Denies suicidal ideation. She is interested in considering counseling in the future. Start bupropion. Follow-up in one month or sooner if needed.      Relevant Medications   buPROPion (WELLBUTRIN SR) 150 MG 12 hr tablet   Tobacco use    Continues to smoke approximately one half pack per day totaling 15-pack-year history. She is transitioning from the contemplation stage to the action stage of change. Discussed methods of tobacco cessation including patches, gums, inhalers, and medication. Start bupropion. Resources provided and after visit summary. Follow-up in one month or sooner if needed.          I have discontinued Ms. Bourbon's oxyCODONE-acetaminophen and  progesterone. I am also having her start on buPROPion. Additionally, I am having her maintain her ibuprofen and sulfamethoxazole-trimethoprim.   Meds ordered this encounter  Medications  . buPROPion (WELLBUTRIN SR) 150 MG 12 hr tablet    Sig: Take 1 tablet by mouth once daily for 3 days and then 1 tablet twice daily.    Dispense:  60 tablet    Refill:  2    Order Specific Question:   Supervising Provider    Answer:   Pricilla Holm A [[id number]]     Follow-up: Return in about 1 month (around 06/26/2017), or if symptoms worsen or fail to improve.   Mauricio Po, FNP

## 2017-05-27 NOTE — Patient Instructions (Addendum)
Thank you for choosing [facility].  SUMMARY AND INSTRUCTIONS:  Please start the bupropion.  Plan to quit smoking in 1-2 weeks from starting the medication.  Please schedule a follow up for 1-2 months    Medication:  Your prescription(s) have been submitted to your pharmacy or been printed and provided for you. Please take as directed and contact our office if you believe you are having problem(s) with the medication(s) or have any questions.  Labs:  Please stop by the lab on the lower level of the building for your blood work. Your results will be released to [place] (or called to you) after review, usually within 72 hours after test completion. If any changes need to be made, you will be notified at that same time.  1.) The lab is open from 7:30am to 5:30 pm Monday-Friday 2.) No appointment is necessary 3.) Fasting (if needed) is 6-8 hours after food and drink; black coffee and water are okay   Follow up:  If your symptoms worsen or fail to improve, please contact our office for further instruction, or in case of emergency go directly to the emergency room at the closest medical facility.   Health Maintenance, Female Adopting a healthy lifestyle and getting preventive care can go a long way to promote health and wellness. Talk with your health care provider about what schedule of regular examinations is right for you. This is a good chance for you to check in with your provider about disease prevention and staying healthy. In between checkups, there are plenty of things you can do on your own. Experts have done a lot of research about which lifestyle changes and preventive measures are most likely to keep you healthy. Ask your health care provider for more information. Weight and diet Eat a healthy diet  Be sure to include plenty of vegetables, fruits, low-fat dairy products, and lean protein.  Do not eat a lot of foods high in solid fats, added sugars, or salt.  Get  regular exercise. This is one of the most important things you can do for your health. ? Most adults should exercise for at least 150 minutes each week. The exercise should increase your heart rate and make you sweat (moderate-intensity exercise). ? Most adults should also do strengthening exercises at least twice a week. This is in addition to the moderate-intensity exercise.  Maintain a healthy weight  Body mass index (BMI) is a measurement that can be used to identify possible weight problems. It estimates body fat based on height and weight. Your health care provider can help determine your BMI and help you achieve or maintain a healthy weight.  For females 7 years of age and older: ? A BMI below 18.5 is considered underweight. ? A BMI of 18.5 to 24.9 is normal. ? A BMI of 25 to 29.9 is considered overweight. ? A BMI of 30 and above is considered obese.  Watch levels of cholesterol and blood lipids  You should start having your blood tested for lipids and cholesterol at 53 years of age, then have this test every 5 years.  You may need to have your cholesterol levels checked more often if: ? Your lipid or cholesterol levels are high. ? You are older than 53 years of age. ? You are at high risk for heart disease.  Cancer screening Lung Cancer  Lung cancer screening is recommended for adults 79-29 years old who are at high risk for lung cancer because of a history  of smoking.  A yearly low-dose CT scan of the lungs is recommended for people who: ? Currently smoke. ? Have quit within the past 15 years. ? Have at least a 30-pack-year history of smoking. A pack year is smoking an average of one pack of cigarettes a day for 1 year.  Yearly screening should continue until it has been 15 years since you quit.  Yearly screening should stop if you develop a health problem that would prevent you from having lung cancer treatment.  Breast Cancer  Practice breast self-awareness. This  means understanding how your breasts normally appear and feel.  It also means doing regular breast self-exams. Let your health care provider know about any changes, no matter how small.  If you are in your 20s or 30s, you should have a clinical breast exam (CBE) by a health care provider every 1-3 years as part of a regular health exam.  If you are 19 or older, have a CBE every year. Also consider having a breast X-ray (mammogram) every year.  If you have a family history of breast cancer, talk to your health care provider about genetic screening.  If you are at high risk for breast cancer, talk to your health care provider about having an MRI and a mammogram every year.  Breast cancer gene (BRCA) assessment is recommended for women who have family members with BRCA-related cancers. BRCA-related cancers include: ? Breast. ? Ovarian. ? Tubal. ? Peritoneal cancers.  Results of the assessment will determine the need for genetic counseling and BRCA1 and BRCA2 testing.  Cervical Cancer Your health care provider may recommend that you be screened regularly for cancer of the pelvic organs (ovaries, uterus, and vagina). This screening involves a pelvic examination, including checking for microscopic changes to the surface of your cervix (Pap test). You may be encouraged to have this screening done every 3 years, beginning at age 82.  For women ages 60-65, health care providers may recommend pelvic exams and Pap testing every 3 years, or they may recommend the Pap and pelvic exam, combined with testing for human papilloma virus (HPV), every 5 years. Some types of HPV increase your risk of cervical cancer. Testing for HPV may also be done on women of any age with unclear Pap test results.  Other health care providers may not recommend any screening for nonpregnant women who are considered low risk for pelvic cancer and who do not have symptoms. Ask your health care provider if a screening pelvic exam  is right for you.  If you have had past treatment for cervical cancer or a condition that could lead to cancer, you need Pap tests and screening for cancer for at least 20 years after your treatment. If Pap tests have been discontinued, your risk factors (such as having a new sexual partner) need to be reassessed to determine if screening should resume. Some women have medical problems that increase the chance of getting cervical cancer. In these cases, your health care provider may recommend more frequent screening and Pap tests.  Colorectal Cancer  This type of cancer can be detected and often prevented.  Routine colorectal cancer screening usually begins at 53 years of age and continues through 53 years of age.  Your health care provider may recommend screening at an earlier age if you have risk factors for colon cancer.  Your health care provider may also recommend using home test kits to check for hidden blood in the stool.  A small camera  at the end of a tube can be used to examine your colon directly (sigmoidoscopy or colonoscopy). This is done to check for the earliest forms of colorectal cancer.  Routine screening usually begins at age 59.  Direct examination of the colon should be repeated every 5-10 years through 53 years of age. However, you may need to be screened more often if early forms of precancerous polyps or small growths are found.  Skin Cancer  Check your skin from head to toe regularly.  Tell your health care provider about any new moles or changes in moles, especially if there is a change in a mole's shape or color.  Also tell your health care provider if you have a mole that is larger than the size of a pencil eraser.  Always use sunscreen. Apply sunscreen liberally and repeatedly throughout the day.  Protect yourself by wearing long sleeves, pants, a wide-brimmed hat, and sunglasses whenever you are outside.  Heart disease, diabetes, and high blood  pressure  High blood pressure causes heart disease and increases the risk of stroke. High blood pressure is more likely to develop in: ? People who have blood pressure in the high end of the normal range (130-139/85-89 mm Hg). ? People who are overweight or obese. ? People who are African American.  If you are 4-35 years of age, have your blood pressure checked every 3-5 years. If you are 59 years of age or older, have your blood pressure checked every year. You should have your blood pressure measured twice-once when you are at a hospital or clinic, and once when you are not at a hospital or clinic. Record the average of the two measurements. To check your blood pressure when you are not at a hospital or clinic, you can use: ? An automated blood pressure machine at a pharmacy. ? A home blood pressure monitor.  If you are between 79 years and 2 years old, ask your health care provider if you should take aspirin to prevent strokes.  Have regular diabetes screenings. This involves taking a blood sample to check your fasting blood sugar level. ? If you are at a normal weight and have a low risk for diabetes, have this test once every three years after 53 years of age. ? If you are overweight and have a high risk for diabetes, consider being tested at a younger age or more often. Preventing infection Hepatitis B  If you have a higher risk for hepatitis B, you should be screened for this virus. You are considered at high risk for hepatitis B if: ? You were born in a country where hepatitis B is common. Ask your health care provider which countries are considered high risk. ? Your parents were born in a high-risk country, and you have not been immunized against hepatitis B (hepatitis B vaccine). ? You have HIV or AIDS. ? You use needles to inject street drugs. ? You live with someone who has hepatitis B. ? You have had sex with someone who has hepatitis B. ? You get hemodialysis  treatment. ? You take certain medicines for conditions, including cancer, organ transplantation, and autoimmune conditions.  Hepatitis C  Blood testing is recommended for: ? Everyone born from [birth year] through 1967. ? Anyone with known risk factors for hepatitis C.  Sexually transmitted infections (STIs)  You should be screened for sexually transmitted infections (STIs) including gonorrhea and chlamydia if: ? You are sexually active and are younger than 53 years of age. ?  You are older than 53 years of age and your health care provider tells you that you are at risk for this type of infection. ? Your sexual activity has changed since you were last screened and you are at an increased risk for chlamydia or gonorrhea. Ask your health care provider if you are at risk.  If you do not have HIV, but are at risk, it may be recommended that you take a prescription medicine daily to prevent HIV infection. This is called pre-exposure prophylaxis (PrEP). You are considered at risk if: ? You are sexually active and do not regularly use condoms or know the HIV status of your partner(s). ? You take drugs by injection. ? You are sexually active with a partner who has HIV.  Talk with your health care provider about whether you are at high risk of being infected with HIV. If you choose to begin PrEP, you should first be tested for HIV. You should then be tested every 3 months for as long as you are taking PrEP. Pregnancy  If you are premenopausal and you may become pregnant, ask your health care provider about preconception counseling.  If you may become pregnant, take 400 to 800 micrograms (mcg) of folic acid every day.  If you want to prevent pregnancy, talk to your health care provider about birth control (contraception). Osteoporosis and menopause  Osteoporosis is a disease in which the bones lose minerals and strength with aging. This can result in serious bone fractures. Your risk for osteoporosis  can be identified using a bone density scan.  If you are 56 years of age or older, or if you are at risk for osteoporosis and fractures, ask your health care provider if you should be screened.  Ask your health care provider whether you should take a calcium or vitamin D supplement to lower your risk for osteoporosis.  Menopause may have certain physical symptoms and risks.  Hormone replacement therapy may reduce some of these symptoms and risks. Talk to your health care provider about whether hormone replacement therapy is right for you. Follow these instructions at home:  Schedule regular health, dental, and eye exams.  Stay current with your immunizations.  Do not use any tobacco products including cigarettes, chewing tobacco, or electronic cigarettes.  If you are pregnant, do not drink alcohol.  If you are breastfeeding, limit how much and how often you drink alcohol.  Limit alcohol intake to no more than 1 drink per day for nonpregnant women. One drink equals 12 ounces of beer, 5 ounces of wine, or 1 ounces of hard liquor.  Do not use street drugs.  Do not share needles.  Ask your health care provider for help if you need support or information about quitting drugs.  Tell your health care provider if you often feel depressed.  Tell your health care provider if you have ever been abused or do not feel safe at home. This information is not intended to replace advice given to you by your health care provider. Make sure you discuss any questions you have with your health care provider. Document Released: 06/24/2011 Document Revised: 05/16/2016 Document Reviewed: 09/12/2015 Elsevier Interactive Patient Education  [employer].

## 2017-05-27 NOTE — Assessment & Plan Note (Signed)
PH Q9 score of 7 indicating mild depression with no evidence of psychosis. Several family and work related stressors contributing to her current situation. Denies suicidal ideation. She is interested in considering counseling in the future. Start bupropion. Follow-up in one month or sooner if needed.

## 2017-05-27 NOTE — Assessment & Plan Note (Signed)
Continues to smoke approximately one half pack per day totaling 15-pack-year history. She is transitioning from the contemplation stage to the action stage of change. Discussed methods of tobacco cessation including patches, gums, inhalers, and medication. Start bupropion. Resources provided and after visit summary. Follow-up in one month or sooner if needed.

## 2017-06-03 ENCOUNTER — Encounter: Payer: Self-pay | Admitting: Anesthesiology

## 2017-06-10 DIAGNOSIS — Z0289 Encounter for other administrative examinations: Secondary | ICD-10-CM

## 2017-06-11 NOTE — Patient Instructions (Addendum)
Your procedure is scheduled on:  Tuesday, June 17, 2017  Enter through the [employer] of [facility] at:  9:30 AM  Pick up the phone at the desk and dial [phone].  Call this number if you have problems the morning of surgery: [phone].  Remember: Do NOT eat food or drink after:  Midnight Monday  Take these medicines the morning of surgery with a SIP OF WATER:  Wellbutrin  Stop ALL herbal medications at this time  Do NOT smoke the day of surgery.  Do NOT wear jewelry (body piercing), metal hair clips/bobby pins, make-up, artifical eyelashes or nail polish. Do NOT wear lotions, powders, or perfumes.  You may wear deodorant. Do NOT shave for 48 hours prior to surgery. Do NOT bring valuables to the hospital. Contacts, dentures, or bridgework may not be worn into surgery.  Leave suitcase in car.  After surgery it may be brought to your room.  For patients admitted to the hospital, checkout time is 11:00 AM the day of discharge.  Bring a copy of your healthcare power of attorney and living will documents.

## 2017-06-12 ENCOUNTER — Encounter: Payer: Self-pay | Admitting: Anesthesiology

## 2017-06-12 ENCOUNTER — Encounter (HOSPITAL_COMMUNITY): Payer: Self-pay

## 2017-06-12 ENCOUNTER — Encounter (HOSPITAL_COMMUNITY)
Admission: RE | Admit: 2017-06-12 | Discharge: 2017-06-12 | Disposition: A | Discharge status: Home / Self Care | Payer: BLUE CROSS/BLUE SHIELD | Source: Ambulatory Visit | Attending: Obstetrics & Gynecology | Admitting: Obstetrics & Gynecology

## 2017-06-12 DIAGNOSIS — Z01812 Encounter for preprocedural laboratory examination: Secondary | ICD-10-CM | POA: Insufficient documentation

## 2017-06-12 HISTORY — DX: Depression, unspecified: F32.A

## 2017-06-12 HISTORY — DX: Anxiety disorder, unspecified: F41.9

## 2017-06-12 HISTORY — DX: Major depressive disorder, single episode, unspecified: F32.9

## 2017-06-12 LAB — CBC
HCT: 43.9 % (ref 36.0–46.0)
HEMOGLOBIN: 15.2 g/dL — AB (ref 12.0–15.0)
MCH: 31.9 pg (ref 26.0–34.0)
MCHC: 34.6 g/dL (ref 30.0–36.0)
MCV: 92 fL (ref 78.0–100.0)
Platelets: 278 10*3/uL (ref 150–400)
RBC: 4.77 MIL/uL (ref 3.87–5.11)
RDW: 14.5 % (ref 11.5–15.5)
WBC: 8.2 10*3/uL (ref 4.0–10.5)

## 2017-06-12 LAB — TYPE AND SCREEN
ABO/RH(D): O NEG
ANTIBODY SCREEN: NEGATIVE

## 2017-06-12 LAB — ABO/RH: ABO/RH(D): O NEG

## 2017-06-17 ENCOUNTER — Encounter (HOSPITAL_COMMUNITY): Payer: Self-pay

## 2017-06-17 ENCOUNTER — Ambulatory Visit (HOSPITAL_COMMUNITY): Payer: BLUE CROSS/BLUE SHIELD | Admitting: Anesthesiology

## 2017-06-17 ENCOUNTER — Observation Stay (HOSPITAL_COMMUNITY)
Admission: RE | Admit: 2017-06-17 | Discharge: 2017-06-18 | Disposition: A | Discharge status: Home / Self Care | Payer: BLUE CROSS/BLUE SHIELD | Source: Ambulatory Visit | Attending: Obstetrics & Gynecology | Admitting: Obstetrics & Gynecology

## 2017-06-17 ENCOUNTER — Encounter (HOSPITAL_COMMUNITY)
Admission: RE | Disposition: A | Discharge status: Home / Self Care | Payer: Self-pay | Source: Ambulatory Visit | Attending: Obstetrics & Gynecology

## 2017-06-17 DIAGNOSIS — N8502 Endometrial intraepithelial neoplasia [EIN]: Secondary | ICD-10-CM | POA: Diagnosis present

## 2017-06-17 DIAGNOSIS — C541 Malignant neoplasm of endometrium: Secondary | ICD-10-CM | POA: Diagnosis not present

## 2017-06-17 DIAGNOSIS — E785 Hyperlipidemia, unspecified: Secondary | ICD-10-CM | POA: Insufficient documentation

## 2017-06-17 DIAGNOSIS — F17210 Nicotine dependence, cigarettes, uncomplicated: Secondary | ICD-10-CM | POA: Insufficient documentation

## 2017-06-17 DIAGNOSIS — Z9851 Tubal ligation status: Secondary | ICD-10-CM | POA: Insufficient documentation

## 2017-06-17 DIAGNOSIS — Z91018 Allergy to other foods: Secondary | ICD-10-CM | POA: Diagnosis not present

## 2017-06-17 DIAGNOSIS — Z885 Allergy status to narcotic agent status: Secondary | ICD-10-CM | POA: Insufficient documentation

## 2017-06-17 DIAGNOSIS — B1920 Unspecified viral hepatitis C without hepatic coma: Secondary | ICD-10-CM | POA: Diagnosis not present

## 2017-06-17 DIAGNOSIS — Z8589 Personal history of malignant neoplasm of other organs and systems: Secondary | ICD-10-CM | POA: Insufficient documentation

## 2017-06-17 DIAGNOSIS — Z9889 Other specified postprocedural states: Secondary | ICD-10-CM

## 2017-06-17 DIAGNOSIS — Z880 Allergy status to penicillin: Secondary | ICD-10-CM | POA: Diagnosis not present

## 2017-06-17 DIAGNOSIS — M1990 Unspecified osteoarthritis, unspecified site: Secondary | ICD-10-CM | POA: Insufficient documentation

## 2017-06-17 DIAGNOSIS — F419 Anxiety disorder, unspecified: Secondary | ICD-10-CM | POA: Insufficient documentation

## 2017-06-17 DIAGNOSIS — N800 Endometriosis of uterus: Secondary | ICD-10-CM | POA: Diagnosis not present

## 2017-06-17 DIAGNOSIS — F329 Major depressive disorder, single episode, unspecified: Secondary | ICD-10-CM | POA: Diagnosis not present

## 2017-06-17 HISTORY — PX: ROBOTIC ASSISTED TOTAL HYSTERECTOMY WITH UTEROSACRAL LIGAMENT SUSPENSION: SHX6764

## 2017-06-17 LAB — PREGNANCY, URINE: Preg Test, Ur: NEGATIVE

## 2017-06-17 SURGERY — ROBOTIC ASSISTED TOTAL HYSTERECTOMY WITH UTEROSACRAL LIGAMENT SUSPENSION
Anesthesia: General | Site: Abdomen | Laterality: Bilateral

## 2017-06-17 MED ORDER — PHENYLEPHRINE HCL 10 MG/ML IJ SOLN
INTRAMUSCULAR | Status: DC | PRN
  Administered 2017-06-17: 40 ug via INTRAVENOUS
  Administered 2017-06-17: 80 ug via INTRAVENOUS
  Administered 2017-06-17: 40 ug via INTRAVENOUS
  Administered 2017-06-17: 80 ug via INTRAVENOUS
  Administered 2017-06-17 (×2): 40 ug via INTRAVENOUS
  Administered 2017-06-17: 80 ug via INTRAVENOUS

## 2017-06-17 MED ORDER — LACTATED RINGERS IV SOLN
INTRAVENOUS | Status: DC
  Administered 2017-06-17: 125 mL/h via INTRAVENOUS
  Administered 2017-06-17 (×3): via INTRAVENOUS

## 2017-06-17 MED ORDER — HYDROMORPHONE HCL 1 MG/ML IJ SOLN
0.2500 mg | INTRAMUSCULAR | Status: DC | PRN
  Administered 2017-06-17 (×2): 0.5 mg via INTRAVENOUS

## 2017-06-17 MED ORDER — SODIUM CHLORIDE 0.9 % IJ SOLN
INTRAMUSCULAR | Status: AC
  Filled 2017-06-17: qty 20

## 2017-06-17 MED ORDER — ROCURONIUM BROMIDE 100 MG/10ML IV SOLN
INTRAVENOUS | Status: DC | PRN
  Administered 2017-06-17: 40 mg via INTRAVENOUS
  Administered 2017-06-17: 10 mg via INTRAVENOUS
  Administered 2017-06-17: 5 mg via INTRAVENOUS
  Administered 2017-06-17: 10 mg via INTRAVENOUS
  Administered 2017-06-17: 5 mg via INTRAVENOUS

## 2017-06-17 MED ORDER — PHENYLEPHRINE 40 MCG/ML (10ML) SYRINGE FOR IV PUSH (FOR BLOOD PRESSURE SUPPORT)
PREFILLED_SYRINGE | INTRAVENOUS | Status: AC
  Filled 2017-06-17: qty 10

## 2017-06-17 MED ORDER — SOD CITRATE-CITRIC ACID 500-334 MG/5ML PO SOLN
ORAL | Status: AC
  Administered 2017-06-17: 30 mL via ORAL
  Filled 2017-06-17: qty 15

## 2017-06-17 MED ORDER — LACTATED RINGERS IR SOLN
Status: DC | PRN
  Administered 2017-06-17: 3000 mL

## 2017-06-17 MED ORDER — BUPROPION HCL ER (SR) 150 MG PO TB12
150.0000 mg | ORAL_TABLET | Freq: Two times a day (BID) | ORAL | Status: DC
  Administered 2017-06-18: 150 mg via ORAL
  Filled 2017-06-17 (×4): qty 1

## 2017-06-17 MED ORDER — ROCURONIUM BROMIDE 100 MG/10ML IV SOLN
INTRAVENOUS | Status: AC
  Filled 2017-06-17: qty 1

## 2017-06-17 MED ORDER — LACTATED RINGERS IV SOLN
INTRAVENOUS | Status: DC
  Administered 2017-06-17: 22:00:00 via INTRAVENOUS

## 2017-06-17 MED ORDER — ONDANSETRON HCL 4 MG/2ML IJ SOLN
INTRAMUSCULAR | Status: AC
  Filled 2017-06-17: qty 2

## 2017-06-17 MED ORDER — SCOPOLAMINE 1 MG/3DAYS TD PT72
MEDICATED_PATCH | TRANSDERMAL | Status: AC
  Administered 2017-06-17: 1.5 mg via TRANSDERMAL
  Filled 2017-06-17: qty 1

## 2017-06-17 MED ORDER — KETOROLAC TROMETHAMINE 30 MG/ML IJ SOLN
INTRAMUSCULAR | Status: DC | PRN
  Administered 2017-06-17: 30 mg via INTRAVENOUS

## 2017-06-17 MED ORDER — PROMETHAZINE HCL 25 MG/ML IJ SOLN: 6.2500 mg | INTRAMUSCULAR | Status: DC | PRN

## 2017-06-17 MED ORDER — FENTANYL CITRATE (PF) 100 MCG/2ML IJ SOLN
INTRAMUSCULAR | Status: DC | PRN
  Administered 2017-06-17 (×2): 50 ug via INTRAVENOUS
  Administered 2017-06-17: 100 ug via INTRAVENOUS
  Administered 2017-06-17: 50 ug via INTRAVENOUS

## 2017-06-17 MED ORDER — MIDAZOLAM HCL 2 MG/2ML IJ SOLN
INTRAMUSCULAR | Status: DC | PRN
  Administered 2017-06-17: 2 mg via INTRAVENOUS

## 2017-06-17 MED ORDER — MIDAZOLAM HCL 2 MG/2ML IJ SOLN
INTRAMUSCULAR | Status: AC
  Filled 2017-06-17: qty 2

## 2017-06-17 MED ORDER — BUPIVACAINE HCL (PF) 0.25 % IJ SOLN
INTRAMUSCULAR | Status: DC | PRN
  Administered 2017-06-17: 14 mL

## 2017-06-17 MED ORDER — ONDANSETRON HCL 4 MG/2ML IJ SOLN
INTRAMUSCULAR | Status: DC | PRN
  Administered 2017-06-17: 4 mg via INTRAVENOUS

## 2017-06-17 MED ORDER — DEXAMETHASONE SODIUM PHOSPHATE 10 MG/ML IJ SOLN
INTRAMUSCULAR | Status: DC | PRN
  Administered 2017-06-17: 10 mg via INTRAVENOUS

## 2017-06-17 MED ORDER — GENTAMICIN SULFATE 40 MG/ML IJ SOLN
INTRAVENOUS | Status: AC
  Administered 2017-06-17: 113.75 mL via INTRAVENOUS
  Filled 2017-06-17: qty 7.75

## 2017-06-17 MED ORDER — SOD CITRATE-CITRIC ACID 500-334 MG/5ML PO SOLN
30.0000 mL | ORAL | Status: AC
  Administered 2017-06-17: 30 mL via ORAL

## 2017-06-17 MED ORDER — SUGAMMADEX SODIUM 200 MG/2ML IV SOLN
INTRAVENOUS | Status: DC | PRN
  Administered 2017-06-17: 200 mg via INTRAVENOUS

## 2017-06-17 MED ORDER — EPHEDRINE SULFATE 50 MG/ML IJ SOLN: INTRAMUSCULAR | Status: DC | PRN

## 2017-06-17 MED ORDER — PROPOFOL 10 MG/ML IV BOLUS
INTRAVENOUS | Status: DC | PRN
  Administered 2017-06-17: 160 mg via INTRAVENOUS

## 2017-06-17 MED ORDER — IBUPROFEN 600 MG PO TABS
600.0000 mg | ORAL_TABLET | Freq: Four times a day (QID) | ORAL | Status: DC | PRN
  Administered 2017-06-18: 600 mg via ORAL
  Filled 2017-06-17: qty 1

## 2017-06-17 MED ORDER — HYDROMORPHONE HCL 1 MG/ML IJ SOLN: 0.2500 mg | INTRAMUSCULAR | Status: DC | PRN

## 2017-06-17 MED ORDER — SODIUM CHLORIDE 0.9 % IV SOLN
INTRAVENOUS | Status: DC | PRN
  Administered 2017-06-17: 60 mL

## 2017-06-17 MED ORDER — HYDROMORPHONE HCL 1 MG/ML IJ SOLN
INTRAMUSCULAR | Status: AC
  Administered 2017-06-17: 0.5 mg via INTRAVENOUS
  Filled 2017-06-17: qty 1

## 2017-06-17 MED ORDER — FENTANYL CITRATE (PF) 250 MCG/5ML IJ SOLN
INTRAMUSCULAR | Status: AC
  Filled 2017-06-17: qty 5

## 2017-06-17 MED ORDER — OXYCODONE-ACETAMINOPHEN 5-325 MG PO TABS
1.0000 | ORAL_TABLET | ORAL | Status: DC | PRN
  Administered 2017-06-18: 1 via ORAL
  Filled 2017-06-17: qty 1

## 2017-06-17 MED ORDER — LIDOCAINE HCL (CARDIAC) 20 MG/ML IV SOLN
INTRAVENOUS | Status: DC | PRN
  Administered 2017-06-17: 80 mg via INTRAVENOUS

## 2017-06-17 MED ORDER — DEXAMETHASONE SODIUM PHOSPHATE 10 MG/ML IJ SOLN
INTRAMUSCULAR | Status: AC
  Filled 2017-06-17: qty 1

## 2017-06-17 MED ORDER — PROPOFOL 10 MG/ML IV BOLUS
INTRAVENOUS | Status: AC
  Filled 2017-06-17: qty 20

## 2017-06-17 MED ORDER — SCOPOLAMINE 1 MG/3DAYS TD PT72
1.0000 | MEDICATED_PATCH | Freq: Once | TRANSDERMAL | Status: DC
  Administered 2017-06-17: 1.5 mg via TRANSDERMAL

## 2017-06-17 MED ORDER — BUPIVACAINE HCL (PF) 0.25 % IJ SOLN
INTRAMUSCULAR | Status: AC
  Filled 2017-06-17: qty 30

## 2017-06-17 MED ORDER — LIDOCAINE HCL (CARDIAC) 20 MG/ML IV SOLN
INTRAVENOUS | Status: AC
  Filled 2017-06-17: qty 5

## 2017-06-17 MED ORDER — SODIUM CHLORIDE 0.9 % IJ SOLN
INTRAMUSCULAR | Status: AC
  Filled 2017-06-17: qty 10

## 2017-06-17 MED ORDER — SUGAMMADEX SODIUM 200 MG/2ML IV SOLN
INTRAVENOUS | Status: AC
  Filled 2017-06-17: qty 2

## 2017-06-17 MED ORDER — ROPIVACAINE HCL 5 MG/ML IJ SOLN
INTRAMUSCULAR | Status: AC
  Filled 2017-06-17: qty 30

## 2017-06-17 MED ORDER — KETOROLAC TROMETHAMINE 30 MG/ML IJ SOLN
INTRAMUSCULAR | Status: AC
  Filled 2017-06-17: qty 1

## 2017-06-17 MED ORDER — HYDROMORPHONE HCL 1 MG/ML IJ SOLN
1.0000 mg | INTRAMUSCULAR | Status: DC | PRN
  Administered 2017-06-17 – 2017-06-18 (×3): 1 mg via INTRAVENOUS
  Filled 2017-06-17 (×3): qty 1

## 2017-06-17 MED ORDER — ARTIFICIAL TEARS OPHTHALMIC OINT
TOPICAL_OINTMENT | OPHTHALMIC | Status: AC
  Filled 2017-06-17: qty 3.5

## 2017-06-17 SURGICAL SUPPLY — 58 items
ADH SKN CLS APL DERMABOND .7 (GAUZE/BANDAGES/DRESSINGS) ×1
BARRIER ADHS 3X4 INTERCEED (GAUZE/BANDAGES/DRESSINGS) IMPLANT
BRR ADH 4X3 ABS CNTRL BYND (GAUZE/BANDAGES/DRESSINGS)
CATH FOLEY 3WAY  5CC 16FR (CATHETERS) ×2
CATH FOLEY 3WAY 5CC 16FR (CATHETERS) ×1 IMPLANT
CLOTH BEACON ORANGE TIMEOUT ST (SAFETY) ×3 IMPLANT
CONT PATH 16OZ SNAP LID 3702 (MISCELLANEOUS) ×3 IMPLANT
COVER BACK TABLE 60X90IN (DRAPES) ×6 IMPLANT
COVER TIP SHEARS 8 DVNC (MISCELLANEOUS) ×1 IMPLANT
COVER TIP SHEARS 8MM DA VINCI (MISCELLANEOUS) ×2
DECANTER SPIKE VIAL GLASS SM (MISCELLANEOUS) ×6 IMPLANT
DEFOGGER SCOPE WARMER CLEARIFY (MISCELLANEOUS) ×3 IMPLANT
DERMABOND ADVANCED (GAUZE/BANDAGES/DRESSINGS) ×2
DERMABOND ADVANCED .7 DNX12 (GAUZE/BANDAGES/DRESSINGS) ×1 IMPLANT
DURAPREP 26ML APPLICATOR (WOUND CARE) ×3 IMPLANT
ELECT REM PT RETURN 9FT ADLT (ELECTROSURGICAL) ×3
ELECTRODE REM PT RTRN 9FT ADLT (ELECTROSURGICAL) ×1 IMPLANT
GAUZE VASELINE 3X9 (GAUZE/BANDAGES/DRESSINGS) IMPLANT
GLOVE BIO SURGEON STRL SZ 6.5 (GLOVE) ×6 IMPLANT
GLOVE BIO SURGEONS STRL SZ 6.5 (GLOVE) ×3
GLOVE BIOGEL PI IND STRL 7.0 (GLOVE) ×5 IMPLANT
GLOVE BIOGEL PI INDICATOR 7.0 (GLOVE) ×10
KIT ACCESSORY DA VINCI DISP (KITS) ×2
KIT ACCESSORY DVNC DISP (KITS) ×1 IMPLANT
LEGGING LITHOTOMY PAIR STRL (DRAPES) ×3 IMPLANT
MANIPULATOR ADVINCU DEL 3.0 PL (MISCELLANEOUS) IMPLANT
MANIPULATOR ADVINCU DEL 3.5 PL (MISCELLANEOUS) IMPLANT
MANIPULATOR ADVINCU DEL 4.0 PL (MISCELLANEOUS) IMPLANT
OCCLUDER COLPOPNEUMO (BALLOONS) ×3 IMPLANT
PACK ROBOT WH (CUSTOM PROCEDURE TRAY) ×3 IMPLANT
PACK ROBOTIC GOWN (GOWN DISPOSABLE) ×3 IMPLANT
PACK TRENDGUARD 450 HYBRID PRO (MISCELLANEOUS) ×1 IMPLANT
PACK TRENDGUARD 600 HYBRD PROC (MISCELLANEOUS) IMPLANT
PAD PREP 24X48 CUFFED NSTRL (MISCELLANEOUS) ×3 IMPLANT
SET CYSTO W/LG BORE CLAMP LF (SET/KITS/TRAYS/PACK) IMPLANT
SET IRRIG TUBING LAPAROSCOPIC (IRRIGATION / IRRIGATOR) ×3 IMPLANT
SET TRI-LUMEN FLTR TB AIRSEAL (TUBING) ×3 IMPLANT
SUT ETHIBOND 0 (SUTURE) IMPLANT
SUT VIC AB 4-0 PS2 27 (SUTURE) ×6 IMPLANT
SUT VICRYL 0 UR6 27IN ABS (SUTURE) ×6 IMPLANT
SUT VLOC 180 0 9IN  GS21 (SUTURE) ×2
SUT VLOC 180 0 9IN GS21 (SUTURE) ×1 IMPLANT
SUT VLOC 180 2-0 6IN GS21 (SUTURE) ×12 IMPLANT
SUT VLOC NONABS 0 BL6 GS-21 (SUTURE) IMPLANT
SYR 50ML LL SCALE MARK (SYRINGE) ×3 IMPLANT
SYSTEM CONVERTIBLE TROCAR (TROCAR) ×3 IMPLANT
TIP RUMI ORANGE 6.7MMX12CM (TIP) IMPLANT
TIP UTERINE 5.1X6CM LAV DISP (MISCELLANEOUS) IMPLANT
TIP UTERINE 6.7X10CM GRN DISP (MISCELLANEOUS) ×3 IMPLANT
TIP UTERINE 6.7X6CM WHT DISP (MISCELLANEOUS) IMPLANT
TIP UTERINE 6.7X8CM BLUE DISP (MISCELLANEOUS) ×3 IMPLANT
TOWEL OR 17X24 6PK STRL BLUE (TOWEL DISPOSABLE) ×9 IMPLANT
TRENDGUARD 450 HYBRID PRO PACK (MISCELLANEOUS) ×3
TRENDGUARD 600 HYBRID PROC PK (MISCELLANEOUS)
TROCAR 12M 150ML BLUNT (TROCAR) ×3 IMPLANT
TROCAR DISP BLADELESS 8 DVNC (TROCAR) ×1 IMPLANT
TROCAR DISP BLADELESS 8MM (TROCAR) ×2
TROCAR PORT AIRSEAL 8X120 (TROCAR) ×3 IMPLANT

## 2017-06-17 NOTE — Anesthesia Preprocedure Evaluation (Addendum)
Anesthesia Evaluation  Patient identified by MRN, date of birth, ID band Patient awake    Reviewed: Allergy & Precautions, NPO status , Patient's Chart, lab work & pertinent test results  Airway Mallampati: II  TM Distance: >3 FB Neck ROM: Full    Dental  (+) Dental Advisory Given   Pulmonary pneumonia, Current Smoker,    breath sounds clear to auscultation       Cardiovascular negative cardio ROS   Rhythm:Regular Rate:Normal     Neuro/Psych Anxiety Depression negative neurological ROS     GI/Hepatic negative GI ROS, (+) Hepatitis -, C  Endo/Other  negative endocrine ROS  Renal/GU negative Renal ROS     Musculoskeletal  (+) Arthritis ,   Abdominal   Peds  Hematology negative hematology ROS (+)   Anesthesia Other Findings   Reproductive/Obstetrics                            Anesthesia Physical Anesthesia Plan  ASA: II  Anesthesia Plan: General   Post-op Pain Management:    Induction: Intravenous  PONV Risk Score and Plan: 4 or greater and Ondansetron, Dexamethasone, Propofol, Midazolam and Scopolamine patch - Pre-op  Airway Management Planned: Oral ETT  Additional Equipment:   Intra-op Plan:   Post-operative Plan: Extubation in OR  Informed Consent: I have reviewed the patients History and Physical, chart, labs and discussed the procedure including the risks, benefits and alternatives for the proposed anesthesia with the patient or authorized representative who has indicated his/her understanding and acceptance.   Dental advisory given  Plan Discussed with: CRNA  Anesthesia Plan Comments:         Anesthesia Quick Evaluation

## 2017-06-17 NOTE — Anesthesia Postprocedure Evaluation (Signed)
Anesthesia Post Note  Patient: Kristine Madden  Procedure(s) Performed: Procedure(s) (LRB): ROBOTIC ASSISTED TOTAL HYSTERECTOMY, Bilateral Salpingectomy,  UTEROSACRAL LIGAMENT SUSPENSION (Bilateral)     Patient location during evaluation: PACU Anesthesia Type: General Level of consciousness: awake and alert Pain management: pain level controlled Vital Signs Assessment: post-procedure vital signs reviewed and stable Respiratory status: spontaneous breathing, nonlabored ventilation and respiratory function stable Cardiovascular status: blood pressure returned to baseline and stable Postop Assessment: no signs of nausea or vomiting Anesthetic complications: no    Last Vitals:  Vitals:   06/17/17 1525 06/17/17 1545  BP:  (!) 142/86  Pulse:  77  Resp:  18  Temp: 36.6 C 36.7 C    Last Pain:  Vitals:   06/17/17 1545  TempSrc: Oral  PainSc: 4    Pain Goal: Patients Stated Pain Goal: 5 (06/17/17 0950)               Lynda Rainwater

## 2017-06-17 NOTE — H&P (Signed)
Kristine Madden is an 51 y.o. female. G1P1  RP: Robotic TLH/Bilat Salpingectomy/US ligament suspension for Complex Atypical Endometrial Hyperplasia   Pertinent Gynecological History: Contraception: post menopausal status Blood transfusions: none  GYN: Kristine Madden Resection of Polyps/D+C 12/2016.  Patho:  Complex Atypical Endometrial Hyperplasia. Sexually transmitted diseases: no past history Last mammogram: normal  Last pap: normal  OB History:G1P1   Menstrual History:  No LMP recorded. Patient is postmenopausal.    Past Medical History:  Diagnosis Date  . Anxiety   . Arthritis   . 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 Depression   . Hepatitis C   . Hyperlipidemia   . Pericarditis 09/2016    Past Surgical History:  Procedure Laterality Date  . DILATATION & CURETTAGE/HYSTEROSCOPY WITH MYOSURE N/A 01/03/2017   Procedure: [place];  Surgeon: Kristine Bruins, MD;  Location: [facility];  Service: Gynecology;  Laterality: N/A;  . EYE SURGERY    . HEMORRHOID SURGERY    . KNEE ARTHROSCOPY    . 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 SPLENECTOMY, TOTAL    . TONSILLECTOMY    . TUBAL LIGATION      Family History  Problem Relation Age of Onset  . Arthritis Mother   . Hyperlipidemia Mother   . Hypertension Father     Social History:  reports that she has been smoking Cigarettes.  She has a 15.00 pack-year smoking history. She has never used smokeless tobacco. She reports that she drinks about 1.8 - 2.4 oz of alcohol per week . She reports that she does not use drugs.  Allergies:  Allergies  Allergen Reactions  . Hydrocodone Nausea And Vomiting  . Penicillins Hives    Has patient had a PCN reaction causing immediate rash, facial/tongue/throat swelling, SOB or lightheadedness with hypotension: No Has patient had a PCN reaction causing severe rash involving mucus membranes or skin necrosis: No Has patient had a PCN reaction that required  hospitalization No Has patient had a PCN reaction occurring within the last 10 years: No If all of the above answers are "NO", then may proceed with Cephalosporin use.   . Other Rash    [place]    No prescriptions prior to admission.    ROS  Neg  Physical Exam VSS See office notes  Assessment/Plan:  Pre-operative examination  Complex atypical endometrial hyperplasia: Dx discussed, informed that it is a pre-cancerous lesion. Risk of progression to Cancer reviewed. Management options and counseling done. Decision to proceed with Robotic TLH/Bilateral Salpingectomy. Suspension using the UteroSacral ligaments discussed because of patient's concern of long term descent.                Patient was counseled as to the risk of surgery to include the following:  1. Infection (prohylactic antibiotics will be administered)  2. DVT/Pulmonary Embolism (prophylactic pneumo compression stockings will be used)  3.Trauma to internal organs requiring additional surgical procedure to repair any injury to     Internal organs requiring perhaps additional hospitalization days.  4.Hemmorhage requiring transfusion and blood products which carry risks such as anaphylactic reaction, hepatitis and AIDS  Patient had received literature information on the procedure scheduled and all her questions were answered and fully accepts all risk.   Kristine Madden 06/17/2017, 8:40 AM

## 2017-06-17 NOTE — Anesthesia Procedure Notes (Signed)
Procedure Name: Intubation Date/Time: 06/17/2017 10:37 AM Performed by: Casimer Lanius A Pre-anesthesia Checklist: Patient identified, Emergency Drugs available, Suction available and Patient being monitored Patient Re-evaluated:Patient Re-evaluated prior to inductionOxygen Delivery Method: Circle system utilized and Simple face mask Preoxygenation: Pre-oxygenation with 100% oxygen Intubation Type: IV induction, Cricoid Pressure applied and Inhalational induction Ventilation: Mask ventilation without difficulty Laryngoscope Size: Mac and 3 Grade View: Grade IV Tube type: Oral Tube size: 7.0 mm Number of attempts: 3 (1 LB Mac 2 Grade 4, 2 RF Miller 2, Glide lopro 4) Airway Equipment and Method: Stylet,  Video-laryngoscopy and Rigid stylet Placement Confirmation: ETT inserted through vocal cords under direct vision,  positive ETCO2 and breath sounds checked- equal and bilateral Secured at: 20 (right lip) cm Tube secured with: Tape Dental Injury: Teeth and Oropharynx as per pre-operative assessment  Difficulty Due To: Difficulty was unanticipated Future Recommendations: Recommend- induction with short-acting agent, and alternative techniques readily available

## 2017-06-17 NOTE — Transfer of Care (Signed)
Immediate Anesthesia Transfer of Care Note  Patient: Kristine Madden  Procedure(s) Performed: Procedure(s) with comments: ROBOTIC ASSISTED TOTAL HYSTERECTOMY, Bilateral Salpingectomy,  UTEROSACRAL LIGAMENT SUSPENSION (Bilateral) -    RNFA confirmed on 05/12/17  Dr. Dellis Filbert assisting in [place] room 3  Patient Location: PACU  Anesthesia Type:General  Level of Consciousness: sedated  Airway & Oxygen Therapy: Patient Spontanous Breathing and Patient connected to nasal cannula oxygen  Post-op Assessment: Report given to RN  Post vital signs: Reviewed and stable  Last Vitals:  Vitals:   06/17/17 0950  BP: (!) 142/77  Pulse: 66  Resp: 16  Temp: 36.6 C    Last Pain:  Vitals:   06/17/17 0950  TempSrc: Oral      Patients Stated Pain Goal: 5 (03/70/48 8891)  Complications: No apparent anesthesia complications

## 2017-06-17 NOTE — Op Note (Signed)
Operative Note  06/17/2017  3:39 PM  PATIENT:  Kristine Madden  51 y.o. female  PRE-OPERATIVE DIAGNOSIS:  Complex endometrial hyperplasia with atypia  POST-OPERATIVE DIAGNOSIS:  Complex endometrial hyperplasia with atypia  PROCEDURE:  Procedure(s): ROBOTIC ASSISTED TOTAL LAPAROSCOPIC HYSTERECTOMY, Bilateral Salpingectomy,  UTEROSACRAL LIGAMENT SUSPENSION  SURGEON:  Surgeon(s): [name], MD  ANESTHESIA:   general  DESCRIPTION OF OPERATION:  Under general anesthesia with endotracheal intubation the patient is in lithotomy position. She is prepped with DuraPrep on the abdomen and with Betadine on the suprapubic, vulvar and vaginal areas. She is draped as usual. A time out is done.  In spite of the risk of endometrial cancer, the patient has declined bilateral oophorectomy.  She knows that if endometrial cancer is diagnosed a second surgery will be needed to remove the ovaries and possibly do staging.  Patient is not menopaused and wants to conserve her estrogen and progesterone production.  Vaginally the uterus is anteverted normal volume no adnexal mass felt. We put a #8 Rumi in the uterus with a large Koe ring at the cervix and removed the other instruments. A Foley catheter is put in place in the bladder. Abdominally, a supra umbilical infiltration of Marcaine one quarter plain is done followed by a 1.5 cm incision with the scalpel.  No hernia is felt or seen at the periumbilical area. Cokers are used to grasp the aponeurosis which is opened with Mayo scissors. The parietal peritoneum is opened bluntly with the finger. A pursestring stitch of Vicryl 0 is applied on the aponeurosis.  The Sheryle Hail is inserted under direct vision and up pneumoperitoneum is created with CO2.  The camera is used at that level to inspect the abdominopelvic cavities.  No adhesion is present with the anterior wall of the abdomen.  The uterus is normal in volume and appearance, both tubes are status post tubal  ligation, both ovaries are normal in size and appearance. No pathology is seen in the pelvis.  No evidence of pathology in the abdomen, the liver and appendix appeared normal.  We used a semicircular configuration for port placement.  Each site is infiltrated with Marcaine one quarter plain and small incisions are made with the scalpel.  2 robotic ports are inserted under direct vision on the right side.  One robotic port is inserted under direct vision on the lower left side.  An 8 mm assistant port is inserted under direct vision on the upper left side.   The patient is positioned in deep Trendelenburg.   The robot his docked from the right side.  The robotic instruments are inserted under direct vision with the Endo Shears scissors in the first arm, the PK in the second arm and the fenestrated clamp in the third arm. We go to the console.       Inspection of the pelvic cavity, confirming normal anatomy except for status post tubal ligation as described above.  Both ureters are in normal anatomic position with good peristalsis.  We starts on the left side with cauterization and section of the left round ligament. We then cauterized and section just below the left tube at the mesosalpinx. The tube was completely detached and removed from the abdomen through the assistant port. We then cauterized and section the left utero-ovarian ligament. We cauterized and section the broad ligament and stopped just before the uterine artery area and we opened the visceral peritoneum anteriorly and start descending the bladder. We proceed exactly the same way on the  right side.  We then cauterized the right uterine artery just above the Koh ring. We cauterized it higher as well to controlled the backflow.  We proceed the same way on the left side. We then sectioned the left uterine artery, and we then sectioned the right uterine artery.  Hemostasis is adequate at all levels.  The vaginal occluder is inflated. We then opened the  upper vagina circumferentially on the Koh ring with the tip of the Endo Shears scissors,  starting anteriorly and then posteriorly and finishing on either side.  The uterus with cervix are completely freed and passed vaginally. That specimen and both tubes are sent together to pathology.  Hemostasis is controlled with the PK at the vaginal vault.  We then switched instruments to the cutting needle driver in the first arm, the long tip in the second arm and the PK in the third arm. Sutures are passed through the assistant port and always removed immediately through that same port.  We used a V lock 0 at 9 inches in a running stitch to close the vaginal vault, starting at the right angle and almost all the way to the left angle.  Because the vaginal wall is rather thick, we used a V lock 0 at 6 inches to start back at the left angle and finish in the middle.  We keep the V lock tail a little long to be able to pull the vaginal vault and see the uterosacral ligaments well.  The uterosacral ligaments are nice and prominent and far away from the ureters on either side.  We used the Prolene 0 to suspend the right uterosacral ligament to the vaginal vault, slightly on the right of the midline.  The ligament is shortened by about 3 cm.  We proceed the same way on the left side.  Good peristalsis of both ureters was well visualized after the uterosacral ligament suspension.  Hemostasis is adequate at all levels.  Irrigation and suction was used as necessary during the case.  All robotic instruments are removed under direct vision.  The robot is undocked.  We go to laparoscopy time.      The deep Trendelenburg is removed and we proceed with irrigation and suction again.  Hemostasis is confirmed once more.  Bupivacaine is poured in the abdominopelvic cavities.  All laparoscopic instruments are removed.  The ports are removed under direct vision.  The CO2 is evacuated.  The supraumbilical incision is closed by attaching the  pursestring at the aponeurosis.  The Bovie is used to complete hemostasis at all incisions.  We closed the skin with a subcuticular suture of Vicryl 4-0 at all incisions.  Hemostasis was adequate at all levels.  The occluder was removed from the vagina.  The patient was brought to recovery room in good and stable status.  ESTIMATED BLOOD LOSS:  40 cc   Intake/Output Summary (Last 24 hours) at 06/17/17 1539 Last data filed at 06/17/17 1528  Gross per 24 hour  Intake             2400 ml  Output              240 ml  Net             2160 ml     BLOOD ADMINISTERED:none   LOCAL MEDICATIONS USED:  MARCAINE    and BUPIVICAINE   SPECIMEN:  Source of Specimen:  Uterus with cervix, bilateral tubes  DISPOSITION OF SPECIMEN:  PATHOLOGY  COUNTS:  YES  PLAN OF CARE: Transfer to PACU  Marie-Lyne LavoieMD3:39 PM

## 2017-06-18 ENCOUNTER — Encounter (HOSPITAL_COMMUNITY): Payer: Self-pay | Admitting: Obstetrics & Gynecology

## 2017-06-18 DIAGNOSIS — C541 Malignant neoplasm of endometrium: Secondary | ICD-10-CM | POA: Diagnosis not present

## 2017-06-18 LAB — CBC
HCT: 38.9 % (ref 36.0–46.0)
HEMOGLOBIN: 13.3 g/dL (ref 12.0–15.0)
MCH: 31.6 pg (ref 26.0–34.0)
MCHC: 34.2 g/dL (ref 30.0–36.0)
MCV: 92.4 fL (ref 78.0–100.0)
PLATELETS: 232 10*3/uL (ref 150–400)
RBC: 4.21 MIL/uL (ref 3.87–5.11)
RDW: 15 % (ref 11.5–15.5)
WBC: 17.5 10*3/uL — AB (ref 4.0–10.5)

## 2017-06-18 MED ORDER — OXYCODONE-ACETAMINOPHEN 7.5-325 MG PO TABS: 1.0000 | ORAL_TABLET | ORAL | 0 refills | Status: DC | PRN

## 2017-06-18 NOTE — Discharge Summary (Signed)
Physician Discharge Summary  Patient ID: Kristine Madden MRN: [id number] DOB/AGE: [birth year] 51 y.o.  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  Vitals:   06/18/17 1200 06/18/17 1246  BP: (!) 155/93 (!) 160/84  Pulse: 79 64  Resp: 18   Temp: 98.6 F (37 C)      Total I/O In: 360 [P.O.:360] Out: [place] Hospital Course: Good    Discharge Exam:  Good  Disposition: 01-Home or Self Care     Allergies as of 06/18/2017      Reactions   Hydrocodone Nausea And Vomiting   Penicillins Hives   Has patient had a PCN reaction causing immediate rash, facial/tongue/throat swelling, SOB or lightheadedness with hypotension: No Has patient had a PCN reaction causing severe rash involving mucus membranes or skin necrosis: No Has patient had a PCN reaction that required hospitalization No Has patient had a PCN reaction occurring within the last 10 years: No If all of the above answers are "NO", then may proceed with Cephalosporin use.   Other Rash   Mango      Medication List    TAKE these medications   ADULT GUMMY PO Take 2 tablets by mouth 2 (two) times a week.   buPROPion 150 MG 12 hr tablet Commonly known as:  WELLBUTRIN SR Take 1 tablet by mouth once daily for 3 days and then 1 tablet twice daily. What changed:  how much to take  how to take this  when to take this  additional instructions   ibuprofen 200 MG  tablet Commonly known as:  ADVIL,MOTRIN Take 400-600 mg by mouth every 6 (six) hours as needed for headache or moderate pain (depends on pain level if takes 2-3).   oxyCODONE-acetaminophen 7.5-325 MG tablet Commonly known as:  PERCOCET Take 1 tablet by mouth every 4 (four) hours as needed for severe pain.   sulfamethoxazole-trimethoprim 800-160 MG tablet Commonly known as:  BACTRIM DS,SEPTRA DS Take 1 tablet by mouth 2 (two) times daily.        Follow-up Information    [name], MD In 3 weeks.   Specialty:  Obstetrics and Gynecology Contact information: [address] [place] [address] [phone]            Signed: [name] 06/18/2017, 1:50 PM  Note: This dictation was prepared with  Dragon/digital dictation along withSmart phrase technology. Any transcriptional errors that result from this process are unintentional.

## 2017-06-18 NOTE — Progress Notes (Signed)
06/18/2017 POD#1  Subjective: Patient reports tolerating PO and no problems voiding.    Objective: I have reviewed patient's vital signs.  vital signs, intake and output, medications, labs and pathology.  Vitals:   06/18/17 1200 06/18/17 1246  BP: (!) 155/93 (!) 160/84  Pulse: 79 64  Resp: 18   Temp: 98.6 F (37 C)    I/O last 3 completed shifts: In: 2520 [P.O.:120; I.V.:2400] Out: 2465 [Urine:2425; Blood:40] Total I/O In: 360 [P.O.:360] Out: 500 [Urine:500]  Results for orders placed or performed during the hospital encounter of 06/17/17 (from the past 24 hour(s))  CBC     Status: Abnormal   Collection Time: 06/18/17  5:55 AM  Result Value Ref Range   WBC 17.5 (H) 4.0 - 10.5 K/uL   RBC 4.21 3.87 - 5.11 MIL/uL   Hemoglobin 13.3 12.0 - 15.0 g/dL   HCT 38.9 36.0 - 46.0 %   MCV 92.4 78.0 - 100.0 fL   MCH 31.6 26.0 - 34.0 pg   MCHC 34.2 30.0 - 36.0 g/dL   RDW 15.0 11.5 - 15.5 %   Platelets 232 150 - 400 K/uL    EXAM General: alert and cooperative Resp: clear to auscultation bilaterally Cardio: regular rate and rhythm GI: Soft, mild distension, normal Pop tenderness.  BS present.  Incisions intact. Extremities: extremities normal, atraumatic, no cyanosis or edema Vaginal Bleeding: none  Pathology:  Phone call from pathologist today:  Endometrioid endometrial AdenoCa grade 1, very superficial, no myometrial invasion.    Assessment: s/p Procedure(s): ROBOTIC ASSISTED TOTAL HYSTERECTOMY, Bilateral Salpingectomy,  UTEROSACRAL LIGAMENT SUSPENSION: progressing well and tolerating diet Endometrioid Endometrial AdenoCa grade 1, superficial (no myometrial invasion).  Patho findings discussed with patient.  Will refer to Gyn-Onco.  Will need Bilateral Oophorectomy.  Plan: Discontinue IV fluids Discharge home  LOS: 0 days    [name], MD 06/18/2017 1:20 PM    06/18/2017, 1:20 PM

## 2017-06-18 NOTE — Discharge Instructions (Signed)

## 2017-06-18 NOTE — Progress Notes (Signed)
Pt out in wheelchair with family  Teaching complete  

## 2017-06-19 ENCOUNTER — Telehealth: Payer: Self-pay | Admitting: *Deleted

## 2017-06-19 MED ORDER — ONDANSETRON HCL 4 MG PO TABS: 4.0000 mg | ORAL_TABLET | Freq: Three times a day (TID) | ORAL | 0 refills | Status: DC | PRN

## 2017-06-19 MED ORDER — HYDROMORPHONE HCL 2 MG PO TABS: 2.0000 mg | ORAL_TABLET | ORAL | 0 refills | Status: DC | PRN

## 2017-06-19 NOTE — Telephone Encounter (Signed)
Received a call from [place] to set the patietn up for a new patient consult. Appt made and call left message with Anderson Malta regarding the appt on Monday.

## 2017-06-19 NOTE — Telephone Encounter (Signed)
Pt had Robotic TLH/Bilateral Salpingectomy on 06/17/17, pt husband called stating pt had vomiting all night with percocet 7/325 mg, took OTC Emetrol to help with nausea. No other complaints, asked if patient can be prescribed another pain medication and Rx for nausea?  Pt can't take Vicodin or hydrocodone, per husband states pt done well with dilaudid in hospital. Please advise

## 2017-06-19 NOTE — Telephone Encounter (Signed)
Spoke with patient husband Rx printed and signed for Dilaudid and Zofran Rx sent to pharmacy. Husband in route to pick up Rx.

## 2017-06-19 NOTE — Telephone Encounter (Signed)
Sharyn Lull called back stating pt can be seen on 06/23/17 @ 8:45am with Dr.Rossi, left message for pt to call.

## 2017-06-19 NOTE — Telephone Encounter (Signed)
Yes, agree with Dilaudid 2 mg PO q4 hrs PRN  #20, no refill.  Zofran 4 mg PO TID PRN #15, no refill.

## 2017-06-19 NOTE — Telephone Encounter (Signed)
Pathology: Endometrioid Endometrial Adenocarcinoma grade 1, superficial (no Myometrial invasion). Just had Robotic TLH/Bilateral Salpingectomy. Patient had declined Bilateral Oophorectomy in spite of knowing the risk of cancer, so will need additional surgery with Gyn-Onco. Appointment in [place] preferably, but if timing is better in [facility], let patient decided.  Left message for GYN oncology to call me back regarding scheduling appointment.

## 2017-06-20 NOTE — Telephone Encounter (Signed)
Pt never called back, I left several message for pt to call appointment rescheduled on 06/27/17 @ 9:30am

## 2017-06-23 ENCOUNTER — Encounter: Payer: Self-pay | Admitting: Family

## 2017-06-23 ENCOUNTER — Ambulatory Visit: Payer: BLUE CROSS/BLUE SHIELD | Admitting: Gynecologic Oncology

## 2017-06-23 NOTE — Telephone Encounter (Signed)
Pt informed

## 2017-06-26 ENCOUNTER — Ambulatory Visit (INDEPENDENT_AMBULATORY_CARE_PROVIDER_SITE_OTHER): Payer: BLUE CROSS/BLUE SHIELD | Admitting: Obstetrics & Gynecology

## 2017-06-26 ENCOUNTER — Telehealth: Payer: Self-pay | Admitting: *Deleted

## 2017-06-26 ENCOUNTER — Encounter: Payer: Self-pay | Admitting: Obstetrics & Gynecology

## 2017-06-26 VITALS — BP 138/86

## 2017-06-26 DIAGNOSIS — C541 Malignant neoplasm of endometrium: Secondary | ICD-10-CM

## 2017-06-26 DIAGNOSIS — N99820 Postprocedural hemorrhage and hematoma of a genitourinary system organ or structure following a genitourinary system procedure: Secondary | ICD-10-CM | POA: Diagnosis not present

## 2017-06-26 LAB — CBC
HCT: 41.8 % (ref 35.0–45.0)
Hemoglobin: 14.1 g/dL (ref 11.7–15.5)
MCH: 31.3 pg (ref 27.0–33.0)
MCHC: 33.7 g/dL (ref 32.0–36.0)
MCV: 92.9 fL (ref 80.0–100.0)
MPV: 10 fL (ref 7.5–12.5)
PLATELETS: 516 10*3/uL — AB (ref 140–400)
RBC: 4.5 MIL/uL (ref 3.80–5.10)
RDW: 13.9 % (ref 11.0–15.0)
WBC: 16.3 10*3/uL — AB (ref 3.8–10.8)

## 2017-06-26 MED ORDER — METRONIDAZOLE 500 MG PO TABS: 500.0000 mg | ORAL_TABLET | Freq: Two times a day (BID) | ORAL | 0 refills | Status: DC

## 2017-06-26 NOTE — Telephone Encounter (Signed)
Pt called c/o bleeding post Robotic TLH/Bilateral Salpingectomy on 06/17/17, c/o heavy bright red bleeding this pm, notes 1 day of bleeding last week. Today heavy bleeding with discharge, pt said she went though 2 heavy pads in 1 hours. Pt advised to come to office now,pt will be here at office by 4:00pm

## 2017-06-26 NOTE — Progress Notes (Signed)
    Kristine Madden 1966-03-05 [id number]        51 y.o.  G1P1  RP:  9 days post TLH.  Had fluidy vaginal d/c with odor x a few days.  Yesterday started having vaginal spotting and today had heavier dark fluidy vaginal bleeding.  Mild RLQ discomfort.  No fever.  Mictions normal.  BMs normal.     Robotic TLH/Bilateral Salpingectomy/USL suspension 06/17/2017.  Dx of Endometrioid AdenoCa of Endometrium grade 1, superficial (no myometrial invasion).  Patient had declined Oophorectomy.  Gyn-Onco visit tomorrow 7/6th to plan further management including BSO.  Past medical history,surgical history, problem list, medications, allergies, family history and social history were all reviewed and documented in the EPIC chart.  Directed ROS with pertinent positives and negatives documented in the history of present illness/assessment and plan.  Exam:  Vitals:   06/26/17 1607  BP: 138/86   General appearance:  Normal  Abdo:  Soft, no distention.  NT.  Gyn exam:  Vulva normal                     Speculum:  Vaginal vault intact, no dehiscence.  No active bleeding.  No blood clot.  Mild dark blood/fluid present at line of sutures.   Assessment/Plan:  51 y.o.   1. Postoperative vaginal bleeding following genitourinary procedure No active bleeding.  Spontaneous evacuation of vaginal vault collection/Early pelvic abscess?  Will draw CBC now.  Start on Flagyl 500 mg PO BID x 7 days (all to Pen).  CT abdo-pelvis asap.  F/U with me tomorrow pm to reassess.  Precautions discussed.  If heavier, bright red bleeding, will present at [facility]. - CBC  2. Endometrial cancer [facility]) Robotic TLH/Bilateral Salpingectomy/USL suspension 06/17/2017.  Dx of Endometrioid AdenoCa of Endometrium grade 1, superficial (no myometrial invasion).  Patient had declined Oophorectomy.  Gyn-Onco visit tomorrow 7/6th to plan further management including BSO.  Counseling on above issues >50% x 15 minutes.  [name]  MD, 4:12 PM 06/26/2017

## 2017-06-26 NOTE — Telephone Encounter (Signed)
Per staff message by Dr.lavoie "Postop TLH with Vaginal watery/bloody discharge with odor. R/O Pelvic fluid collection/abscess for CT scan abdominal/pelvis. Called [facility] hospital to see if patient can be seen tomorrow, but they only had a 5pm appointment. Dr. Dellis Filbert  told me she wanted pt to have CT scan scheduled on 06/27/17. I will try [facility] imaging  in the am.

## 2017-06-26 NOTE — Patient Instructions (Signed)
1. Postoperative vaginal bleeding following genitourinary procedure No active bleeding.  Spontaneous evacuation of vaginal vault collection/Early pelvic abscess?  Will draw CBC now.  Start on Flagyl 500 mg PO BID x 7 days (all to Pen).  CT abdo-pelvis asap.  F/U with me tomorrow pm to reassess.  Precautions discussed.  If heavier, bright red bleeding, will present at [facility]. - CBC  2. Endometrial cancer [facility]) Robotic TLH/Bilateral Salpingectomy/USL suspension 06/17/2017.  Dx of Endometrioid AdenoCa of Endometrium grade 1, superficial (no myometrial invasion).  Patient had declined Oophorectomy.  Gyn-Onco visit tomorrow 7/6th to plan further management including BSO.  I will see you tomorrow.

## 2017-06-27 ENCOUNTER — Inpatient Hospital Stay (HOSPITAL_COMMUNITY)
Admission: AD | Admit: 2017-06-27 | Discharge: 2017-07-06 | DRG: 988 | Disposition: A | Discharge status: Home / Self Care | Payer: BLUE CROSS/BLUE SHIELD | Source: Ambulatory Visit | Attending: Obstetrics & Gynecology | Admitting: Obstetrics & Gynecology

## 2017-06-27 ENCOUNTER — Inpatient Hospital Stay (HOSPITAL_COMMUNITY): Payer: BLUE CROSS/BLUE SHIELD | Admitting: Anesthesiology

## 2017-06-27 ENCOUNTER — Telehealth: Payer: Self-pay

## 2017-06-27 ENCOUNTER — Ambulatory Visit: Payer: BLUE CROSS/BLUE SHIELD | Admitting: Obstetrics & Gynecology

## 2017-06-27 ENCOUNTER — Encounter (HOSPITAL_COMMUNITY): Payer: Self-pay

## 2017-06-27 ENCOUNTER — Encounter: Payer: Self-pay | Admitting: Gynecologic Oncology

## 2017-06-27 ENCOUNTER — Encounter (HOSPITAL_COMMUNITY)
Admission: AD | Disposition: A | Discharge status: Home / Self Care | Payer: Self-pay | Source: Ambulatory Visit | Attending: Obstetrics & Gynecology

## 2017-06-27 ENCOUNTER — Ambulatory Visit: Payer: BLUE CROSS/BLUE SHIELD

## 2017-06-27 ENCOUNTER — Ambulatory Visit (HOSPITAL_BASED_OUTPATIENT_CLINIC_OR_DEPARTMENT_OTHER): Payer: BLUE CROSS/BLUE SHIELD | Admitting: Gynecologic Oncology

## 2017-06-27 VITALS — BP 128/88 | HR 84 | Temp 98.0°F | Resp 18 | Ht 67.0 in | Wt 132.7 lb

## 2017-06-27 DIAGNOSIS — F17210 Nicotine dependence, cigarettes, uncomplicated: Secondary | ICD-10-CM

## 2017-06-27 DIAGNOSIS — F419 Anxiety disorder, unspecified: Secondary | ICD-10-CM | POA: Insufficient documentation

## 2017-06-27 DIAGNOSIS — N938 Other specified abnormal uterine and vaginal bleeding: Secondary | ICD-10-CM

## 2017-06-27 DIAGNOSIS — T814XXA Infection following a procedure, initial encounter: Secondary | ICD-10-CM | POA: Diagnosis not present

## 2017-06-27 DIAGNOSIS — T8131XA Disruption of external operation (surgical) wound, not elsewhere classified, initial encounter: Secondary | ICD-10-CM

## 2017-06-27 DIAGNOSIS — Z90710 Acquired absence of both cervix and uterus: Secondary | ICD-10-CM

## 2017-06-27 DIAGNOSIS — F329 Major depressive disorder, single episode, unspecified: Secondary | ICD-10-CM

## 2017-06-27 DIAGNOSIS — J9811 Atelectasis: Secondary | ICD-10-CM | POA: Diagnosis not present

## 2017-06-27 DIAGNOSIS — N993 Prolapse of vaginal vault after hysterectomy: Secondary | ICD-10-CM | POA: Diagnosis not present

## 2017-06-27 DIAGNOSIS — L259 Unspecified contact dermatitis, unspecified cause: Secondary | ICD-10-CM | POA: Diagnosis not present

## 2017-06-27 DIAGNOSIS — B1920 Unspecified viral hepatitis C without hepatic coma: Secondary | ICD-10-CM | POA: Insufficient documentation

## 2017-06-27 DIAGNOSIS — R140 Abdominal distension (gaseous): Secondary | ICD-10-CM

## 2017-06-27 DIAGNOSIS — C541 Malignant neoplasm of endometrium: Secondary | ICD-10-CM | POA: Diagnosis not present

## 2017-06-27 DIAGNOSIS — E876 Hypokalemia: Secondary | ICD-10-CM | POA: Diagnosis not present

## 2017-06-27 DIAGNOSIS — D72829 Elevated white blood cell count, unspecified: Secondary | ICD-10-CM | POA: Diagnosis not present

## 2017-06-27 DIAGNOSIS — N739 Female pelvic inflammatory disease, unspecified: Secondary | ICD-10-CM | POA: Diagnosis present

## 2017-06-27 DIAGNOSIS — K567 Ileus, unspecified: Secondary | ICD-10-CM

## 2017-06-27 DIAGNOSIS — N939 Abnormal uterine and vaginal bleeding, unspecified: Secondary | ICD-10-CM

## 2017-06-27 DIAGNOSIS — N99820 Postprocedural hemorrhage and hematoma of a genitourinary system organ or structure following a genitourinary system procedure: Principal | ICD-10-CM | POA: Diagnosis present

## 2017-06-27 DIAGNOSIS — Z880 Allergy status to penicillin: Secondary | ICD-10-CM

## 2017-06-27 DIAGNOSIS — E785 Hyperlipidemia, unspecified: Secondary | ICD-10-CM | POA: Insufficient documentation

## 2017-06-27 DIAGNOSIS — D649 Anemia, unspecified: Secondary | ICD-10-CM | POA: Diagnosis present

## 2017-06-27 DIAGNOSIS — Z9889 Other specified postprocedural states: Secondary | ICD-10-CM

## 2017-06-27 HISTORY — PX: REPAIR VAGINAL CUFF: SHX6067

## 2017-06-27 LAB — CBC
HCT: 39.7 % (ref 36.0–46.0)
HEMATOCRIT: 37.5 % (ref 36.0–46.0)
HEMOGLOBIN: 12.8 g/dL (ref 12.0–15.0)
Hemoglobin: 13.6 g/dL (ref 12.0–15.0)
MCH: 31.4 pg (ref 26.0–34.0)
MCH: 31.4 pg (ref 26.0–34.0)
MCHC: 34.3 g/dL (ref 30.0–36.0)
MCHC: 34.1 g/dL (ref 30.0–36.0)
MCV: 91.7 fL (ref 78.0–100.0)
MCV: 92.1 fL (ref 78.0–100.0)
PLATELETS: 497 10*3/uL — AB (ref 150–400)
Platelets: 498 10*3/uL — ABNORMAL HIGH (ref 150–400)
RBC: 4.33 MIL/uL (ref 3.87–5.11)
RBC: 4.07 MIL/uL (ref 3.87–5.11)
RDW: 14.3 % (ref 11.5–15.5)
RDW: 14.5 % (ref 11.5–15.5)
WBC: 14.1 10*3/uL — AB (ref 4.0–10.5)
WBC: 20 10*3/uL — AB (ref 4.0–10.5)

## 2017-06-27 LAB — TYPE AND SCREEN
ABO/RH(D): O NEG
ANTIBODY SCREEN: NEGATIVE

## 2017-06-27 SURGERY — REPAIR, VAGINAL CUFF
Anesthesia: Spinal | Site: Vagina

## 2017-06-27 MED ORDER — IBUPROFEN 100 MG/5ML PO SUSP
200.0000 mg | Freq: Four times a day (QID) | ORAL | Status: DC | PRN
  Filled 2017-06-27: qty 20

## 2017-06-27 MED ORDER — MIDAZOLAM HCL 2 MG/2ML IJ SOLN
INTRAMUSCULAR | Status: DC | PRN
  Administered 2017-06-27: 0.5 mg via INTRAVENOUS
  Administered 2017-06-27: 1 mg via INTRAVENOUS
  Administered 2017-06-27: 0.5 mg via INTRAVENOUS

## 2017-06-27 MED ORDER — LACTATED RINGERS IV BOLUS (SEPSIS)
1000.0000 mL | Freq: Once | INTRAVENOUS | Status: AC
  Administered 2017-06-27: 1000 mL via INTRAVENOUS

## 2017-06-27 MED ORDER — ONDANSETRON HCL 4 MG/2ML IJ SOLN
INTRAMUSCULAR | Status: AC
  Filled 2017-06-27: qty 2

## 2017-06-27 MED ORDER — HYDROMORPHONE HCL 2 MG PO TABS
4.0000 mg | ORAL_TABLET | ORAL | Status: DC | PRN
  Administered 2017-06-27: 4 mg via ORAL
  Filled 2017-06-27: qty 2

## 2017-06-27 MED ORDER — FENTANYL CITRATE (PF) 100 MCG/2ML IJ SOLN
INTRAMUSCULAR | Status: AC
  Filled 2017-06-27: qty 2

## 2017-06-27 MED ORDER — KETOROLAC TROMETHAMINE 30 MG/ML IJ SOLN
INTRAMUSCULAR | Status: AC
  Filled 2017-06-27: qty 1

## 2017-06-27 MED ORDER — PROPOFOL 500 MG/50ML IV EMUL
INTRAVENOUS | Status: DC | PRN
  Administered 2017-06-27: 50 ug/kg/min via INTRAVENOUS

## 2017-06-27 MED ORDER — PROPOFOL 10 MG/ML IV BOLUS
INTRAVENOUS | Status: AC
  Filled 2017-06-27: qty 20

## 2017-06-27 MED ORDER — BUPIVACAINE IN DEXTROSE 0.75-8.25 % IT SOLN
INTRATHECAL | Status: AC
  Filled 2017-06-27: qty 2

## 2017-06-27 MED ORDER — METRONIDAZOLE 500 MG PO TABS
500.0000 mg | ORAL_TABLET | Freq: Two times a day (BID) | ORAL | Status: DC
  Administered 2017-06-27: 500 mg via ORAL
  Filled 2017-06-27: qty 1

## 2017-06-27 MED ORDER — FENTANYL CITRATE (PF) 100 MCG/2ML IJ SOLN: 25.0000 ug | INTRAMUSCULAR | Status: DC | PRN

## 2017-06-27 MED ORDER — METRONIDAZOLE IN NACL 5-0.79 MG/ML-% IV SOLN
500.0000 mg | Freq: Once | INTRAVENOUS | Status: AC
  Administered 2017-06-27: 500 mg via INTRAVENOUS
  Filled 2017-06-27: qty 100

## 2017-06-27 MED ORDER — MIDAZOLAM HCL 2 MG/2ML IJ SOLN
INTRAMUSCULAR | Status: AC
Start: 2017-06-27 — End: 2017-06-27
  Filled 2017-06-27: qty 2

## 2017-06-27 MED ORDER — LIDOCAINE HCL (CARDIAC) 20 MG/ML IV SOLN
INTRAVENOUS | Status: AC
  Filled 2017-06-27: qty 5

## 2017-06-27 MED ORDER — LACTATED RINGERS IV SOLN
INTRAVENOUS | Status: DC
  Administered 2017-06-27 – 2017-06-28 (×4): via INTRAVENOUS

## 2017-06-27 MED ORDER — MIDAZOLAM HCL 2 MG/2ML IJ SOLN
INTRAMUSCULAR | Status: AC
  Filled 2017-06-27: qty 2

## 2017-06-27 MED ORDER — ONDANSETRON HCL 4 MG/2ML IJ SOLN: 4.0000 mg | Freq: Once | INTRAMUSCULAR | Status: DC | PRN

## 2017-06-27 MED ORDER — FENTANYL CITRATE (PF) 100 MCG/2ML IJ SOLN
INTRAMUSCULAR | Status: DC | PRN
  Administered 2017-06-27 (×2): 25 ug via INTRAVENOUS
  Administered 2017-06-27: 50 ug via INTRAVENOUS

## 2017-06-27 MED ORDER — LACTATED RINGERS IV SOLN
INTRAVENOUS | Status: DC
  Administered 2017-06-27: 13:00:00 via INTRAVENOUS
  Administered 2017-06-27: 1000 mL via INTRAVENOUS

## 2017-06-27 MED ORDER — BUPIVACAINE IN DEXTROSE 0.75-8.25 % IT SOLN
INTRATHECAL | Status: DC | PRN
  Administered 2017-06-27: 1.6 mL via INTRATHECAL

## 2017-06-27 MED ORDER — KETOROLAC TROMETHAMINE 30 MG/ML IJ SOLN: 30.0000 mg | Freq: Once | INTRAMUSCULAR | Status: DC | PRN

## 2017-06-27 MED ORDER — MEPERIDINE HCL 25 MG/ML IJ SOLN: 6.2500 mg | INTRAMUSCULAR | Status: DC | PRN

## 2017-06-27 MED ORDER — FAMOTIDINE IN NACL 20-0.9 MG/50ML-% IV SOLN
20.0000 mg | Freq: Once | INTRAVENOUS | Status: AC
  Administered 2017-06-27: 20 mg via INTRAVENOUS
  Filled 2017-06-27: qty 50

## 2017-06-27 MED ORDER — BUPROPION HCL ER (SR) 150 MG PO TB12
150.0000 mg | ORAL_TABLET | Freq: Two times a day (BID) | ORAL | Status: DC
  Administered 2017-06-27: 150 mg via ORAL
  Filled 2017-06-27 (×2): qty 1

## 2017-06-27 MED ORDER — FENTANYL CITRATE (PF) 250 MCG/5ML IJ SOLN
INTRAMUSCULAR | Status: AC
  Filled 2017-06-27: qty 5

## 2017-06-27 MED ORDER — IBUPROFEN 200 MG PO TABS
200.0000 mg | ORAL_TABLET | Freq: Four times a day (QID) | ORAL | Status: DC | PRN
  Filled 2017-06-27: qty 2

## 2017-06-27 MED ORDER — ONDANSETRON HCL 4 MG/2ML IJ SOLN
INTRAMUSCULAR | Status: DC | PRN
  Administered 2017-06-27: 4 mg via INTRAVENOUS

## 2017-06-27 MED ORDER — IBUPROFEN 600 MG PO TABS: 600.0000 mg | ORAL_TABLET | Freq: Four times a day (QID) | ORAL | Status: DC | PRN

## 2017-06-27 MED ORDER — HYDROMORPHONE HCL 1 MG/ML IJ SOLN
1.0000 mg | INTRAMUSCULAR | Status: DC | PRN
  Administered 2017-06-27: 1 mg via INTRAVENOUS
  Filled 2017-06-27: qty 1

## 2017-06-27 SURGICAL SUPPLY — 3 items
NS IRRIG 1000ML POUR BTL (IV SOLUTION) ×3 IMPLANT
PACK VAGINAL WOMENS (CUSTOM PROCEDURE TRAY) ×3 IMPLANT
SUT VICRYL 0 UR6 27IN ABS (SUTURE) ×9 IMPLANT

## 2017-06-27 NOTE — Patient Instructions (Addendum)
Nothing to eat or drink and present to Maternity Admissions at [facility].  Dr. Dellis Filbert will arrange repair of your vaginal cuff.  Plan to follow up with Dr. Denman George in one year or sooner if needed.  Please call [phone] in March or April 2019 to schedule an appt for July 2019 with Dr. Denman George.

## 2017-06-27 NOTE — Addendum Note (Signed)
Addendum  created 06/27/17 2133 by Jonna Munro, CRNA   Sign clinical note

## 2017-06-27 NOTE — Progress Notes (Signed)
Consult Note: Gyn-Onc  Consult was requested by Dr. Dellis Filbert for the evaluation of Kristine Madden 51 y.o. female  CC:  Chief Complaint  Patient presents with  . Endometrial Cancer    Assessment/Plan:  Kristine Madden  is a 51 y.o.  year old with clinical stage IA grade 1 endometrial cancer. She also has vaginal cuff dehiscence with vaginal bleeding.   1/ Endometrial cancer: I discussed the cancer and my suspicion that it was likely caused by her exposure to estrogens with inadequate progestin. Fortunately she has prognostically favorable features (microscopic dimension, no myometrial invasion, no LVSI). The likelihood of ovarian or lymph node involvement in such cases is extremely low (>1-2%) and therefore I do not recommend a second surgery for oophorectomy or lymphadenectomy.  No adjuvant therapy is recommended according to NCCN guidelines.  I discussed risk for recurrence and typical symptoms encouraged her to notify us of these should they develop between visits.  I recommend she have follow-up every 6 months for 5 years in accordance with NCCN guidelines. Those visits should include symptom assessment, physical exam and pelvic examination. Pap smears are not indicated or recommended in the routine surveillance of endometrial cancer.  She will follow-up with Dr Dellis Filbert in 6 months and with me in 12 months.  2/ cuff dehiscence: I recommend resuturing the vaginal mucosa in order to achieve hemostasis. This was communicated by me to her surgeon, Dr Dellis Filbert, who is making plans for a same day surgical procedure. We will discharge the patient from our office directly to the [facility] admissions and have advised her to stay NPO.  HPI: Kristine Madden is a 51 year old who is seen in consultation at the request of Dr Dellis Filbert for stage IA grade 1 endometrial cancer.  The patient has a history of menopause at age 5 due to what she reports as work stress. Of note, she is a smoker. She  developed severe symptoms of menopause with unremitting hot flashes, and therefore was treated with hormone replacement therapy using an estrogen and testosterone pellet and an oral progestin. However, she admits that she did not take the oral progestin with regularity.  She developed irregular vaginal bleeding for several years prior to her diagnosis of cancer but did not seek care as she did not have insurance.  In January, 2018 she underwent a hysteroscopy with D&C which diagnosed complex atypical hyperplasia. She was recommended surgery with hysterectomy, but declined and opted for progestin therapy. She eventually agreed to a hysterectomy bilateral salpingectomy (not oophrorectomy) on 06/17/17 with Dr Dellis Filbert. It was a robotic procedure. No conerning findings were noted and the ovaries appeared normal.  Final pathology revealed a FIGO grade 1 endometrioid adenocarcinoma consisting of multiple scattered foci. There was no myometrial invasion and no LVSI. The tubes were normal. No other tissues were available for complete staging, therefore the stage assigned (IA) is a clinical staging based on normal appearance of other tissues clinically.  She did well for 2 weeks postop, however in the past week has developed heavy bright red vaginal bleeding.   Current Meds:  Outpatient Encounter Prescriptions as of 06/27/2017  Medication Sig  . buPROPion (WELLBUTRIN SR) 150 MG 12 hr tablet Take 1 tablet by mouth once daily for 3 days and then 1 tablet twice daily. (Patient taking differently: Take 150 mg by mouth See admin instructions. Take 1 tablet by mouth once daily for 3 days and then 1 tablet twice daily.)  . HYDROmorphone (DILAUDID) 2  MG tablet Take 1 tablet (2 mg total) by mouth every 4 (four) hours as needed for severe pain.  Marland Kitchen ibuprofen (ADVIL,MOTRIN) 200 MG tablet Take 400-600 mg by mouth every 6 (six) hours as needed for headache or moderate pain (depends on pain level if takes 2-3).   .  metroNIDAZOLE (FLAGYL) 500 MG tablet Take 1 tablet (500 mg total) by mouth 2 (two) times daily.  . Multiple Vitamins-Minerals (ADULT GUMMY PO) Take 2 tablets by mouth 2 (two) times a week.  . ondansetron (ZOFRAN) 4 MG tablet Take 1 tablet (4 mg total) by mouth 3 (three) times daily as needed for nausea or vomiting.  Marland Kitchen oxyCODONE-acetaminophen (PERCOCET) 7.5-325 MG tablet Take 1 tablet by mouth every 4 (four) hours as needed for severe pain.   No facility-administered encounter medications on file as of 06/27/2017.     Allergy:  Allergies  Allergen Reactions  . Hydrocodone Nausea And Vomiting  . Penicillins Hives    Has patient had a PCN reaction causing immediate rash, facial/tongue/throat swelling, SOB or lightheadedness with hypotension: No Has patient had a PCN reaction causing severe rash involving mucus membranes or skin necrosis: No Has patient had a PCN reaction that required hospitalization No Has patient had a PCN reaction occurring within the last 10 years: No If all of the above answers are "NO", then may proceed with Cephalosporin use.   . Other Rash    Mango    Social Hx:   Social History   Social History  . Marital status: Married    Spouse name: N/A  . Number of children: 1  . Years of education: 41   Occupational History  . Software Musician    Social History Main Topics  . Smoking status: Current Every Day Smoker    Packs/day: 0.50    Years: 30.00    Types: Cigarettes  . Smokeless tobacco: Never Used  . Alcohol use 1.8 - 2.4 oz/week    3 - 4 Glasses of wine per week  . Drug use: No  . Sexual activity: Not on file   Other Topics Concern  . Not on file   Social History Narrative   Fun: Yoga, Armandina Gemma and dogs.    Denies abuse and feels safe at home.     Past Surgical Hx:  Past Surgical History:  Procedure Laterality Date  . DILATATION & CURETTAGE/HYSTEROSCOPY WITH MYOSURE N/A 01/03/2017   Procedure: [place];  Surgeon: [name], MD;  Location: [facility];  Service: Gynecology;  Laterality: N/A;  . EYE SURGERY    . HEMORRHOID SURGERY    . KNEE ARTHROSCOPY    . 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 ROBOTIC ASSISTED TOTAL HYSTERECTOMY WITH UTEROSACRAL LIGAMENT SUSPENSION Bilateral 06/17/2017   Procedure: ROBOTIC ASSISTED TOTAL HYSTERECTOMY, Bilateral Salpingectomy,  UTEROSACRAL LIGAMENT SUSPENSION;  Surgeon: [name], MD;  Location: [facility];  Service: Gynecology;  Laterality: Bilateral;     RNFA confirmed on 05/12/17  Dr. Dellis Filbert assisting in [employer] room 3  . SPLENECTOMY, TOTAL    . TONSILLECTOMY    . TUBAL LIGATION      Past Medical Hx:  Past Medical History:  Diagnosis Date  . Anxiety   . Arthritis   . Cancer [facility]) 2010   islet cell tumor  . Depression   . Hepatitis C   . Hyperlipidemia   . Pericarditis 09/2016    Past Gynecological History:  Endometrial cancer. Premature menopause at age  44 treated with "bioidentical" hormones. No LMP recorded. Patient has had a hysterectomy.  Family Hx:  Family History  Problem Relation Age of Onset  . Arthritis Mother   . Hyperlipidemia Mother   . Hypertension Father     Review of Systems:  Constitutional  Feels well,   ENT Normal appearing ears and nares bilaterally Skin/Breast  No rash, sores, jaundice, itching, dryness Cardiovascular  No chest pain, shortness of breath, or edema  Pulmonary  No cough or wheeze.  Gastro Intestinal  No nausea, vomitting, or diarrhoea. No bright red blood per rectum, no abdominal pain, change in bowel movement, or constipation.  Genito Urinary  No frequency, urgency, dysuria, + vaginal bleeding Musculo Skeletal  No myalgia, arthralgia, joint swelling or pain  Neurologic  No weakness, numbness, change in gait,  Psychology  + depression, anxiety, insomnia.   Vitals:  Blood pressure 128/88, pulse 84, temperature 98 F (36.7 C), resp. rate 18, height 5\' 7"  (1.702 m),  weight 132 lb 11.2 oz (60.2 kg), SpO2 100 %.  Physical Exam: WD in NAD Neck  Supple NROM, without any enlargements.  Lymph Node Survey No cervical supraclavicular or inguinal adenopathy Cardiovascular  Pulse normal rate, regularity and rhythm. S1 and S2 normal.  Lungs  Clear to auscultation bilateraly, without wheezes/crackles/rhonchi. Good air movement.  Skin  No rash/lesions/breakdown  Psychiatry  Alert and oriented to person, place, and time  Abdomen  Normoactive bowel sounds, abdomen soft, non-tender and nonobese without evidence of hernia.  Back No CVA tenderness Genito Urinary  Vulva/vagina: Normal external female genitalia.  No lesions. No discharge or bleeding.  Bladder/urethra:  No lesions or masses, well supported bladder  Vagina: vagina filled with approximately 200cc of clot (evacuated). No hemorrhage (active) from cuff, but it is friable and oozing. Cuff mucosa separated across entire cuff, but deeper tisses appear in tact. No evisceration.  Adnexa:bimanual exam not performed due to cuff dehiscence. Recta deferred Extremities  No bilateral cyanosis, clubbing or edema.   Donaciano Eva, MD   06/27/2017, 9:50 AM   Discussion had verbally over phone with surgeon and referring doctor, Dr Dellis Filbert, who made plans to take the patient to OR that same day for cuff repair.

## 2017-06-27 NOTE — Anesthesia Postprocedure Evaluation (Signed)
Anesthesia Post Note  Patient: Kristine Madden  Procedure(s) Performed: Procedure(s) (LRB): REPAIR VAGINAL CUFF (N/A)     Patient location during evaluation: PACU Anesthesia Type: Spinal Level of consciousness: awake Pain management: pain level controlled Vital Signs Assessment: post-procedure vital signs reviewed and stable Respiratory status: spontaneous breathing Cardiovascular status: stable Postop Assessment: no headache, no backache, spinal receding, patient able to bend at knees and no signs of nausea or vomiting Anesthetic complications: no    Last Vitals:  Vitals:   06/27/17 1415 06/27/17 1445  BP: 117/72 110/74  Pulse: 79 65  Resp: 19 12  Temp:      Last Pain:  Vitals:   06/27/17 1445  TempSrc:   PainSc: 0-No pain   Pain Goal: Patients Stated Pain Goal: 3 (06/27/17 1445)               [place]

## 2017-06-27 NOTE — Telephone Encounter (Signed)
Pt CT scan was canceled pt had appointment with Dr.Rossi and she told pt to go to [facility] hospital.

## 2017-06-27 NOTE — Anesthesia Preprocedure Evaluation (Signed)
Anesthesia Evaluation  Patient identified by MRN, date of birth, ID band Patient awake    Reviewed: Allergy & Precautions, NPO status , Patient's Chart, lab work & pertinent test results  Airway Mallampati: II  TM Distance: >3 FB Neck ROM: Full    Dental no notable dental hx. (+) Dental Advisory Given, Teeth Intact   Pulmonary pneumonia, Current Smoker,    Pulmonary exam normal breath sounds clear to auscultation       Cardiovascular negative cardio ROS Normal cardiovascular exam Rhythm:Regular Rate:Normal     Neuro/Psych PSYCHIATRIC DISORDERS Anxiety Depression negative neurological ROS     GI/Hepatic negative GI ROS, (+) Hepatitis -, C  Endo/Other  negative endocrine ROS  Renal/GU negative Renal ROS     Musculoskeletal  (+) Arthritis ,   Abdominal Normal abdominal exam  (+)   Peds  Hematology negative hematology ROS (+)   Anesthesia Other Findings   Reproductive/Obstetrics                             Anesthesia Physical  Anesthesia Plan  ASA: II  Anesthesia Plan: Spinal   Post-op Pain Management:    Induction:   PONV Risk Score and Plan: 4 or greater and Ondansetron, Dexamethasone, Propofol, Midazolam and Scopolamine patch - Pre-op  Airway Management Planned: Nasal Cannula and Natural Airway  Additional Equipment:   Intra-op Plan:   Post-operative Plan:   Informed Consent: I have reviewed the patients History and Physical, chart, labs and discussed the procedure including the risks, benefits and alternatives for the proposed anesthesia with the patient or authorized representative who has indicated his/her understanding and acceptance.     Plan Discussed with: CRNA and Surgeon  Anesthesia Plan Comments:         Anesthesia Quick Evaluation

## 2017-06-27 NOTE — Transfer of Care (Signed)
Immediate Anesthesia Transfer of Care Note  Patient: Kristine Madden  Procedure(s) Performed: Procedure(s): REPAIR VAGINAL CUFF (N/A)  Patient Location: PACU  Anesthesia Type:Spinal  Level of Consciousness: awake, alert , oriented and patient cooperative  Airway & Oxygen Therapy: Patient Spontanous Breathing  Post-op Assessment: Report given to RN and Post -op Vital signs reviewed and stable  Post vital signs: Reviewed and stable  Last Vitals:  Vitals:   06/27/17 1148  BP: 126/76  Pulse: 77  Resp: 18  Temp: 36.7 C    Last Pain:  Vitals:   06/27/17 1149  TempSrc:   PainSc: 3          Complications: No apparent anesthesia complications

## 2017-06-27 NOTE — Progress Notes (Signed)
Patient called out while in bathroom because of moderate about of bleeding with clots, VS obtained, Provider notified, received order for STAT CBC, notify her of the results, and continue monitoring bleeding.

## 2017-06-27 NOTE — Progress Notes (Signed)
Foley catheter removed tolerated well

## 2017-06-27 NOTE — Telephone Encounter (Signed)
I called [facility] and pt can come today at 12:00 so she can drink contrast and ct scan will be at 2:20pm, will need to be NPO (no food) 4 hours prior to scan, can take medications if any. I called pt and left message for her to call me ASAP.

## 2017-06-27 NOTE — Anesthesia Procedure Notes (Signed)
Spinal  Patient location during procedure: OR Start time: 06/27/2017 1:20 PM End time: 06/27/2017 1:23 PM Staffing Anesthesiologist: Lyn Hollingshead Performed: anesthesiologist  Preanesthetic Checklist Completed: patient identified, surgical consent, pre-op evaluation, timeout performed, IV checked, risks and benefits discussed and monitors and equipment checked Spinal Block Patient position: sitting Prep: site prepped and draped and DuraPrep Patient monitoring: heart rate, cardiac monitor, continuous pulse ox and blood pressure Approach: midline Location: L3-4 Injection technique: single-shot Needle Needle type: Pencan  Needle gauge: 24 G Needle length: 10 cm Needle insertion depth: 4 cm Assessment Sensory level: T8

## 2017-06-27 NOTE — Telephone Encounter (Signed)
I spoke with Pam at [place] to authorize [phone] CT of Abd/pelvis. Josem Kaufmann #[id number] valid today until 08/25/17. Note added to appt notes.

## 2017-06-27 NOTE — Op Note (Signed)
Operative Note  06/27/2017  2:33 PM  PATIENT:  Kristine Madden  51 y.o. female  PRE-OPERATIVE DIAGNOSIS:  Post op vaginal bleeding, partial dehiscense of vaginal cuff  POST-OPERATIVE DIAGNOSIS:  Post op vaginal bleeding, partial dehiscense of vaginal cuff   PROCEDURE:  Procedure(s): REPAIR VAGINAL CUFF  SURGEON:  Surgeon(s): [name], MD  ANESTHESIA:   spinal  FINDINGS:  Vaginal cuff closed, but vaginal mucosa superficially dehisced along the suture line.  No active bleeding, but dark blood present at that level.  DESCRIPTION OF OPERATION:  Under spinal anesthesia the patient is in lithotomy position.  The level of anesthesia is verified and found adequate. She is prepped with Betadine on the suprapubic, vulvar and vaginal areas. She is draped as usual. The bladder is catheterized and a Foley is left in place.  Timeout is done.  The patient was given a dose of Flagyl 500 mg IV at the beginning of the intervention.  The weighted speculum is inserted in the vagina.  The anterior vagina is retracted with an narrow curved retractor by my assistant.  No active bleeding is present put the vaginal mucosa is dehisced along the suture line.  The vaginal cuff is intact.  Dark blood is present along the area of separated vaginal mucosa.  Vicryl 0 is used in figure-of-eight's to close the vaginal mucosa from the left angle to the right angle.  A total of 6 stitches are put in place.  Hemostasis is adequate.  The vaginal cuff is strongly closed.  A vaginal exam confirmed no sensation of pelvic mass bulging vaginally.  All vaginal instruments are removed.  The patient is brought to recovery room in good and stable status.  ESTIMATED BLOOD LOSS: 10 cc   Intake/Output Summary (Last 24 hours) at 06/27/17 1433 Last data filed at 06/27/17 1356  Gross per 24 hour  Intake              800 ml  Output              110 ml  Net              690 ml     BLOOD ADMINISTERED:none   LOCAL MEDICATIONS  USED:  NONE  SPECIMEN:  None  DISPOSITION OF SPECIMEN:  N/A  COUNTS:  YES  PLAN OF CARE: Transfer to PACU  Marie-Lyne LavoieMD2:33 PM

## 2017-06-27 NOTE — H&P (Signed)
Kristine Madden is an 51 y.o. female. G1P1  RP:  Postop TLH vaginal bleeding with partial vaginal cuff dehiscence  HPI:    10 days post TLH.  Had fluidy vaginal d/c with odor x a few days.  Wednesday started having vaginal spotting and yesterday had heavier dark fluidy vaginal bleeding.  Mild RLQ discomfort.  No fever.  Mictions normal.  BMs normal.     Yesterday, just dark mild blood at vaginal cuff, no dehiscence. Seen today by Dr Kristine Madden because of Dx of Endometrioid AdenoCa of Endometrium grade 1, superficial.  Will not need a Bilateral Oophorectomy.  On exam:  Partial dehiscence of vaginal cuff with moderate dark blood at cuff.  Decision to bring in hospital for cuff repair vaginally.  Pertinent Gynecological History: Blood transfusions: none Sexually transmitted diseases: no past history Previous GYN Procedures:  Robotic TLH/Bilateral Salpingectomy Last mammogram: Normal Last pap: Normal OB History: G1P1   Menstrual History:  No LMP recorded. Patient has had a hysterectomy.    Past Medical History:  Diagnosis Date  . Anxiety   . Arthritis   . Cancer [facility]) 2010   islet cell tumor  . Depression   . Hepatitis C   . Hyperlipidemia   . Pericarditis 09/2016    Past Surgical History:  Procedure Laterality Date  . DILATATION & CURETTAGE/HYSTEROSCOPY WITH MYOSURE N/A 01/03/2017   Procedure: [place];  Surgeon: Kristine Bruins, MD;  Location: [place] [facility];  Service: Gynecology;  Laterality: N/A;  . EYE SURGERY    . HEMORRHOID SURGERY    . KNEE ARTHROSCOPY    . 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 ROBOTIC ASSISTED TOTAL HYSTERECTOMY WITH UTEROSACRAL LIGAMENT SUSPENSION Bilateral 06/17/2017   Procedure: ROBOTIC ASSISTED TOTAL HYSTERECTOMY, Bilateral Salpingectomy,  UTEROSACRAL LIGAMENT SUSPENSION;  Surgeon: Kristine Bruins, MD;  Location: [place] [facility];  Service: Gynecology;  Laterality: Bilateral;     RNFA confirmed on 05/12/17  Dr.  Dellis Madden assisting in [place] room 3  . SPLENECTOMY, TOTAL    . TONSILLECTOMY    . TUBAL LIGATION      Family History  Problem Relation Age of Onset  . Arthritis Mother   . Hyperlipidemia Mother   . Hypertension Father     Social History:  reports that she has been smoking Cigarettes.  She has a 15.00 pack-year smoking history. She has never used smokeless tobacco. She reports that she drinks about 1.8 - 2.4 oz of alcohol per week . She reports that she does not use drugs.  Allergies:  Allergies  Allergen Reactions  . Hydrocodone Nausea And Vomiting  . Penicillins Hives    Has patient had a PCN reaction causing immediate rash, facial/tongue/throat swelling, SOB or lightheadedness with hypotension: No Has patient had a PCN reaction causing severe rash involving mucus membranes or skin necrosis: No Has patient had a PCN reaction that required hospitalization No Has patient had a PCN reaction occurring within the last 10 years: No If all of the above answers are "NO", then may proceed with Cephalosporin use.   Marland Kitchen Percocet [Oxycodone-Acetaminophen] Itching and Nausea And Vomiting    Nausea and vomitting  . Other Rash    [place]ango    Prescriptions Prior to Admission  Medication Sig Dispense Refill Last Dose  . buPROPion (WELLBUTRIN SR) 150 MG 12 hr tablet Take 1 tablet by mouth once daily for 3 days and then 1 tablet twice daily. (Patient taking differently: Take 150 mg by mouth See admin  instructions. Take 1 tablet by mouth once daily for 3 days and then 1 tablet twice daily.) 60 tablet 2 Taking  . HYDROmorphone (DILAUDID) 2 MG tablet Take 1 tablet (2 mg total) by mouth every 4 (four) hours as needed for severe pain. 20 tablet 0 Taking  . ibuprofen (ADVIL,MOTRIN) 200 MG tablet Take 400-600 mg by mouth every 6 (six) hours as needed for headache or moderate pain (depends on pain level if takes 2-3).    Taking  . metroNIDAZOLE (FLAGYL) 500 MG tablet Take 1 tablet (500 mg total) by mouth 2 (two)  times daily. 14 tablet 0 Taking  . Multiple Vitamins-Minerals (ADULT GUMMY PO) Take 2 tablets by mouth 2 (two) times a week.   Taking  . ondansetron (ZOFRAN) 4 MG tablet Take 1 tablet (4 mg total) by mouth 3 (three) times daily as needed for nausea or vomiting. 15 tablet 0 Taking  . oxyCODONE-acetaminophen (PERCOCET) 7.5-325 MG tablet Take 1 tablet by mouth every 4 (four) hours as needed for severe pain. 20 tablet 0 Taking    ROS Neg  Blood pressure 126/76, pulse 77, temperature 98 F (36.7 C), temperature source Oral, resp. rate 18, height 5\' 7"  (1.702 m), weight 132 lb 11.2 oz (60.2 kg). Physical Exam   Gyn exam:  Partial dehiscence of vaginal cuff with dark blood.  Results for orders placed or performed during the hospital encounter of 06/27/17 (from the past 24 hour(s))  CBC     Status: Abnormal   Collection Time: 06/27/17 12:16 PM  Result Value Ref Range   WBC 14.1 (H) 4.0 - 10.5 K/uL   RBC 4.33 3.87 - 5.11 MIL/uL   Hemoglobin 13.6 12.0 - 15.0 g/dL   HCT 39.7 36.0 - 46.0 %   MCV 91.7 78.0 - 100.0 fL   MCH 31.4 26.0 - 34.0 pg   MCHC 34.3 30.0 - 36.0 g/dL   RDW 14.3 11.5 - 15.5 %   Platelets 497 (H) 150 - 400 K/uL  Type and screen [place]     Status: None (Preliminary result)   Collection Time: 06/27/17 12:16 PM  Result Value Ref Range   ABO/RH(D) O NEG    Antibody Screen PENDING    Sample Expiration 06/30/2017      Assessment/Plan:  1. Postoperative vaginal bleeding following genitourinary procedure No active bleeding, but dark blood and partial dehiscence of vaginal cuff.  Started on Flagyl 500 mg PO BID x 7 days (all to Pen) yesterday.  Decision to proceed with Repair of Vaginal cuff and control of hemostasis under Spinal Anesthesia.  Surgery and risks reviewed, informed consent signed.  2. Endometrial cancer ([place]) Robotic TLH/Bilateral Salpingectomy/USL suspension 06/17/2017.  Dx of Endometrioid AdenoCa of Endometrium grade 1, superficial (no  myometrial invasion).  Patient had declined Oophorectomy.  Gyn-Onco visit this am with Dr Kristine Madden.  Not planning to add a Bilateral Oophorectomy.  Kristine Madden 06/27/2017, 1:06 PM

## 2017-06-27 NOTE — Anesthesia Postprocedure Evaluation (Signed)
Anesthesia Post Note  Patient: Kristine Madden  Procedure(s) Performed: Procedure(s) (LRB): REPAIR VAGINAL CUFF (N/A)     Patient location during evaluation: [facility] Anesthesia Type: Spinal Level of consciousness: awake and alert and oriented Pain management: pain level controlled Vital Signs Assessment: post-procedure vital signs reviewed and stable Respiratory status: spontaneous breathing Cardiovascular status: stable Postop Assessment: no headache, no backache, patient able to bend at knees, no signs of nausea or vomiting and adequate PO intake Anesthetic complications: yes    Last Vitals:  Vitals:   06/27/17 1638 06/27/17 1731  BP: 126/72 (!) 124/58  Pulse: 68 69  Resp: 16 16  Temp: 37.2 C 36.9 C    Last Pain:  Vitals:   06/27/17 2030  TempSrc:   PainSc: 0-No pain   Pain Goal: Patients Stated Pain Goal: 3 (06/27/17 1640)               Willa Rough

## 2017-06-28 ENCOUNTER — Encounter (HOSPITAL_COMMUNITY)
Admission: AD | Disposition: A | Discharge status: Home / Self Care | Payer: Self-pay | Source: Ambulatory Visit | Attending: Obstetrics & Gynecology

## 2017-06-28 ENCOUNTER — Encounter (HOSPITAL_COMMUNITY): Payer: Self-pay | Admitting: Obstetrics & Gynecology

## 2017-06-28 ENCOUNTER — Observation Stay (HOSPITAL_COMMUNITY): Payer: BLUE CROSS/BLUE SHIELD | Admitting: Anesthesiology

## 2017-06-28 DIAGNOSIS — E876 Hypokalemia: Secondary | ICD-10-CM | POA: Diagnosis not present

## 2017-06-28 DIAGNOSIS — K567 Ileus, unspecified: Secondary | ICD-10-CM | POA: Diagnosis not present

## 2017-06-28 DIAGNOSIS — D649 Anemia, unspecified: Secondary | ICD-10-CM | POA: Diagnosis present

## 2017-06-28 DIAGNOSIS — D72829 Elevated white blood cell count, unspecified: Secondary | ICD-10-CM | POA: Diagnosis not present

## 2017-06-28 DIAGNOSIS — Z880 Allergy status to penicillin: Secondary | ICD-10-CM | POA: Diagnosis not present

## 2017-06-28 DIAGNOSIS — J9811 Atelectasis: Secondary | ICD-10-CM | POA: Diagnosis not present

## 2017-06-28 DIAGNOSIS — Z8341 Family history of multiple endocrine neoplasia [MEN] syndrome: Secondary | ICD-10-CM | POA: Diagnosis not present

## 2017-06-28 DIAGNOSIS — N99820 Postprocedural hemorrhage and hematoma of a genitourinary system organ or structure following a genitourinary system procedure: Secondary | ICD-10-CM | POA: Diagnosis present

## 2017-06-28 DIAGNOSIS — T814XXA Infection following a procedure, initial encounter: Secondary | ICD-10-CM | POA: Diagnosis not present

## 2017-06-28 DIAGNOSIS — L259 Unspecified contact dermatitis, unspecified cause: Secondary | ICD-10-CM | POA: Diagnosis not present

## 2017-06-28 DIAGNOSIS — F17210 Nicotine dependence, cigarettes, uncomplicated: Secondary | ICD-10-CM | POA: Diagnosis present

## 2017-06-28 DIAGNOSIS — N8501 Benign endometrial hyperplasia: Secondary | ICD-10-CM | POA: Diagnosis not present

## 2017-06-28 DIAGNOSIS — Z8249 Family history of ischemic heart disease and other diseases of the circulatory system: Secondary | ICD-10-CM | POA: Diagnosis not present

## 2017-06-28 DIAGNOSIS — Z9889 Other specified postprocedural states: Secondary | ICD-10-CM | POA: Diagnosis not present

## 2017-06-28 DIAGNOSIS — K9189 Other postprocedural complications and disorders of digestive system: Secondary | ICD-10-CM | POA: Diagnosis not present

## 2017-06-28 DIAGNOSIS — Z8261 Family history of arthritis: Secondary | ICD-10-CM | POA: Diagnosis not present

## 2017-06-28 DIAGNOSIS — T8131XA Disruption of external operation (surgical) wound, not elsewhere classified, initial encounter: Secondary | ICD-10-CM | POA: Diagnosis present

## 2017-06-28 DIAGNOSIS — C541 Malignant neoplasm of endometrium: Secondary | ICD-10-CM | POA: Diagnosis present

## 2017-06-28 DIAGNOSIS — M7981 Nontraumatic hematoma of soft tissue: Secondary | ICD-10-CM

## 2017-06-28 DIAGNOSIS — Z888 Allergy status to other drugs, medicaments and biological substances status: Secondary | ICD-10-CM | POA: Diagnosis not present

## 2017-06-28 DIAGNOSIS — N739 Female pelvic inflammatory disease, unspecified: Secondary | ICD-10-CM | POA: Diagnosis present

## 2017-06-28 DIAGNOSIS — T814XXS Infection following a procedure, sequela: Secondary | ICD-10-CM | POA: Diagnosis not present

## 2017-06-28 HISTORY — PX: LAPAROSCOPY: SHX197

## 2017-06-28 LAB — CBC WITH DIFFERENTIAL/PLATELET
BAND NEUTROPHILS: 0 %
BASOS PCT: 0 %
BLASTS: 0 %
Basophils Absolute: 0 10*3/uL (ref 0.0–0.1)
EOS ABS: 0 10*3/uL (ref 0.0–0.7)
Eosinophils Relative: 0 %
HCT: 29.3 % — ABNORMAL LOW (ref 36.0–46.0)
Hemoglobin: 10 g/dL — ABNORMAL LOW (ref 12.0–15.0)
LYMPHS PCT: 9 %
Lymphs Abs: 3.2 10*3/uL (ref 0.7–4.0)
MCH: 31.3 pg (ref 26.0–34.0)
MCHC: 34.1 g/dL (ref 30.0–36.0)
MCV: 91.8 fL (ref 78.0–100.0)
MONOS PCT: 6 %
Metamyelocytes Relative: 0 %
Monocytes Absolute: 2.2 10*3/uL — ABNORMAL HIGH (ref 0.1–1.0)
Myelocytes: 0 %
NEUTROS ABS: 30.6 10*3/uL — AB (ref 1.7–7.7)
NRBC: 0 /100{WBCs}
Neutrophils Relative %: 85 %
OTHER: 0 %
PLATELETS: 428 10*3/uL — AB (ref 150–400)
Promyelocytes Absolute: 0 %
RBC: 3.19 MIL/uL — ABNORMAL LOW (ref 3.87–5.11)
RDW: 14.3 % (ref 11.5–15.5)
WBC: 36 10*3/uL — ABNORMAL HIGH (ref 4.0–10.5)

## 2017-06-28 LAB — DIC (DISSEMINATED INTRAVASCULAR COAGULATION)PANEL
D-Dimer, Quant: 4.21 ug/mL-FEU — ABNORMAL HIGH (ref 0.00–0.50)
Fibrinogen: 432 mg/dL (ref 210–475)
Platelets: 434 10*3/uL — ABNORMAL HIGH (ref 150–400)
Prothrombin Time: 14.2 seconds (ref 11.4–15.2)
Smear Review: NONE SEEN

## 2017-06-28 LAB — CBC
HCT: 30.3 % — ABNORMAL LOW (ref 36.0–46.0)
Hemoglobin: 10.3 g/dL — ABNORMAL LOW (ref 12.0–15.0)
MCH: 31 pg (ref 26.0–34.0)
MCHC: 34 g/dL (ref 30.0–36.0)
MCV: 91.3 fL (ref 78.0–100.0)
PLATELETS: 434 10*3/uL — AB (ref 150–400)
RBC: 3.32 MIL/uL — ABNORMAL LOW (ref 3.87–5.11)
RDW: 14.4 % (ref 11.5–15.5)
WBC: 42.2 10*3/uL — AB (ref 4.0–10.5)

## 2017-06-28 LAB — DIC (DISSEMINATED INTRAVASCULAR COAGULATION) PANEL
APTT: 29 s (ref 24–36)
INR: 1.09

## 2017-06-28 SURGERY — LAPAROSCOPY, DIAGNOSTIC
Anesthesia: General | Site: Abdomen

## 2017-06-28 MED ORDER — METOCLOPRAMIDE HCL 5 MG/ML IJ SOLN
INTRAMUSCULAR | Status: DC | PRN
  Administered 2017-06-28: 10 mg via INTRAVENOUS

## 2017-06-28 MED ORDER — SCOPOLAMINE 1 MG/3DAYS TD PT72SCOPOLAMINE 1 MG/3DAYS
MEDICATED_PATCH | TRANSDERMAL | Status: DC | PRN
Start: 2017-06-28 — End: 2017-06-28
  Administered 2017-06-28: 1 via TRANSDERMAL

## 2017-06-28 MED ORDER — PROPOFOL 10 MG/ML IV BOLUS
INTRAVENOUS | Status: AC
  Filled 2017-06-28: qty 20

## 2017-06-28 MED ORDER — PHENYLEPHRINE 8 MG IN D5W 100 ML (0.08MG/ML) PREMIX OPTIME
INJECTION | INTRAVENOUS | Status: AC
  Filled 2017-06-28: qty 100

## 2017-06-28 MED ORDER — PROMETHAZINE HCL 25 MG/ML IJ SOLN: 6.2500 mg | INTRAMUSCULAR | Status: DC | PRN

## 2017-06-28 MED ORDER — HYDROMORPHONE HCL 2 MG PO TABS
2.0000 mg | ORAL_TABLET | ORAL | Status: DC | PRN
  Administered 2017-06-29 – 2017-06-30 (×3): 2 mg via ORAL
  Filled 2017-06-28 (×4): qty 1

## 2017-06-28 MED ORDER — PHENYLEPHRINE 40 MCG/ML (10ML) SYRINGE FOR IV PUSH (FOR BLOOD PRESSURE SUPPORT)
PREFILLED_SYRINGE | INTRAVENOUS | Status: AC
  Filled 2017-06-28: qty 40

## 2017-06-28 MED ORDER — LIDOCAINE HCL (CARDIAC) 20 MG/ML IV SOLN
INTRAVENOUS | Status: AC
  Filled 2017-06-28: qty 5

## 2017-06-28 MED ORDER — BUPIVACAINE HCL (PF) 0.25 % IJ SOLN
INTRAMUSCULAR | Status: AC
  Filled 2017-06-28: qty 30

## 2017-06-28 MED ORDER — LACTATED RINGERS IR SOLN
Status: DC | PRN
  Administered 2017-06-28: 3000 mL

## 2017-06-28 MED ORDER — SUGAMMADEX SODIUM 200 MG/2ML IV SOLN
INTRAVENOUS | Status: DC | PRN
  Administered 2017-06-28: 120.4 mg via INTRAVENOUS

## 2017-06-28 MED ORDER — HYDROMORPHONE HCL 1 MG/ML IJ SOLN
1.0000 mg | INTRAMUSCULAR | Status: DC | PRN
  Administered 2017-06-28 – 2017-07-02 (×9): 1 mg via INTRAVENOUS
  Filled 2017-06-28 (×9): qty 1

## 2017-06-28 MED ORDER — ROCURONIUM BROMIDE 100 MG/10ML IV SOLN
INTRAVENOUS | Status: DC | PRN
  Administered 2017-06-28 (×2): 30 mg via INTRAVENOUS

## 2017-06-28 MED ORDER — HYDROMORPHONE HCL 1 MG/ML IJ SOLN
0.2500 mg | INTRAMUSCULAR | Status: DC | PRN
  Administered 2017-06-28 (×2): 0.5 mg via INTRAVENOUS

## 2017-06-28 MED ORDER — GENTAMICIN SULFATE 40 MG/ML IJ SOLN
5.0000 mg/kg | Freq: Once | INTRAVENOUS | Status: AC
  Administered 2017-06-29: 300 mg via INTRAVENOUS
  Filled 2017-06-28: qty 7.5

## 2017-06-28 MED ORDER — ONDANSETRON HCL 4 MG/2ML IJ SOLN
INTRAMUSCULAR | Status: AC
  Filled 2017-06-28: qty 2

## 2017-06-28 MED ORDER — METOCLOPRAMIDE HCL 5 MG/ML IJ SOLN
INTRAMUSCULAR | Status: AC
  Filled 2017-06-28: qty 2

## 2017-06-28 MED ORDER — FENTANYL CITRATE (PF) 100 MCG/2ML IJ SOLN
INTRAMUSCULAR | Status: AC
  Filled 2017-06-28: qty 2

## 2017-06-28 MED ORDER — MEPERIDINE HCL 25 MG/ML IJ SOLN: 6.2500 mg | INTRAMUSCULAR | Status: DC | PRN

## 2017-06-28 MED ORDER — HYDROMORPHONE HCL 1 MG/ML IJ SOLN
INTRAMUSCULAR | Status: AC
  Filled 2017-06-28: qty 0.5

## 2017-06-28 MED ORDER — FENTANYL CITRATE (PF) 100 MCG/2ML IJ SOLN
INTRAMUSCULAR | Status: DC | PRN
  Administered 2017-06-28: 100 ug via INTRAVENOUS
  Administered 2017-06-28: 150 ug via INTRAVENOUS
  Administered 2017-06-28 (×2): 50 ug via INTRAVENOUS

## 2017-06-28 MED ORDER — LIDOCAINE HCL (CARDIAC) 20 MG/ML IV SOLN
INTRAVENOUS | Status: DC | PRN
  Administered 2017-06-28: 80 mg via INTRAVENOUS

## 2017-06-28 MED ORDER — GENTAMICIN SULFATE 40 MG/ML IJ SOLN
Freq: Once | INTRAMUSCULAR | Status: AC
  Administered 2017-06-28: 113.5 mL via INTRAVENOUS
  Filled 2017-06-28: qty 7.5

## 2017-06-28 MED ORDER — PHENYLEPHRINE HCL 10 MG/ML IJ SOLN
INTRAMUSCULAR | Status: DC | PRN
  Administered 2017-06-28 (×3): 120 ug via INTRAVENOUS
  Administered 2017-06-28: 80 ug via INTRAVENOUS
  Administered 2017-06-28: 120 ug via INTRAVENOUS
  Administered 2017-06-28: 80 ug via INTRAVENOUS
  Administered 2017-06-28 (×5): 120 ug via INTRAVENOUS

## 2017-06-28 MED ORDER — DEXAMETHASONE SODIUM PHOSPHATE 4 MG/ML IJ SOLN
INTRAMUSCULAR | Status: AC
  Filled 2017-06-28: qty 1

## 2017-06-28 MED ORDER — SUCCINYLCHOLINE CHLORIDE 20 MG/ML IJ SOLN
INTRAMUSCULAR | Status: DC | PRN
  Administered 2017-06-28: 120 mg via INTRAVENOUS

## 2017-06-28 MED ORDER — HYDROMORPHONE HCL 1 MG/ML IJ SOLN
INTRAMUSCULAR | Status: AC
  Administered 2017-06-28: 0.5 mg via INTRAVENOUS
  Filled 2017-06-28: qty 0.5

## 2017-06-28 MED ORDER — MIDAZOLAM HCL 2 MG/2ML IJ SOLN
INTRAMUSCULAR | Status: DC | PRN
  Administered 2017-06-28: 2 mg via INTRAVENOUS

## 2017-06-28 MED ORDER — CLINDAMYCIN PHOSPHATE 900 MG/50ML IV SOLN: 900.0000 mg | Freq: Once | INTRAVENOUS | Status: DC

## 2017-06-28 MED ORDER — PHENYLEPHRINE HCL 10 MG/ML IJ SOLN
INTRAMUSCULAR | Status: DC | PRN
  Administered 2017-06-28: 40 ug/min via INTRAVENOUS

## 2017-06-28 MED ORDER — IBUPROFEN 600 MG PO TABS
600.0000 mg | ORAL_TABLET | Freq: Four times a day (QID) | ORAL | Status: DC | PRN
  Administered 2017-06-29 – 2017-07-06 (×10): 600 mg via ORAL
  Filled 2017-06-28 (×11): qty 1

## 2017-06-28 MED ORDER — HYDROMORPHONE HCL 1 MG/ML IJ SOLN
INTRAMUSCULAR | Status: AC
  Filled 2017-06-28: qty 1

## 2017-06-28 MED ORDER — GENTAMICIN IN SALINE 1-0.9 MG/ML-% IV SOLN: 100.0000 mg | Freq: Once | INTRAVENOUS | Status: DC

## 2017-06-28 MED ORDER — SUGAMMADEX SODIUM 200 MG/2ML IV SOLN
INTRAVENOUS | Status: AC
  Filled 2017-06-28: qty 2

## 2017-06-28 MED ORDER — CLINDAMYCIN PHOSPHATE 900 MG/50ML IV SOLN
900.0000 mg | Freq: Three times a day (TID) | INTRAVENOUS | Status: AC
  Administered 2017-06-28 – 2017-06-29 (×3): 900 mg via INTRAVENOUS
  Filled 2017-06-28 (×3): qty 50

## 2017-06-28 MED ORDER — BUPROPION HCL ER (SR) 150 MG PO TB12
150.0000 mg | ORAL_TABLET | Freq: Two times a day (BID) | ORAL | Status: DC
  Administered 2017-06-28 – 2017-07-03 (×6): 150 mg via ORAL
  Filled 2017-06-28 (×15): qty 1

## 2017-06-28 MED ORDER — PROPOFOL 10 MG/ML IV BOLUS
INTRAVENOUS | Status: DC | PRN
  Administered 2017-06-28: 150 mg via INTRAVENOUS

## 2017-06-28 MED ORDER — HYDROMORPHONE HCL 1 MG/ML IJ SOLN
INTRAMUSCULAR | Status: DC | PRN
  Administered 2017-06-28: 1 mg via INTRAVENOUS

## 2017-06-28 MED ORDER — LACTATED RINGERS IV SOLN
INTRAVENOUS | Status: DC
  Administered 2017-06-28: 15:00:00 via INTRAVENOUS
  Administered 2017-06-29: 125 mL/h via INTRAVENOUS

## 2017-06-28 SURGICAL SUPPLY — 55 items
ADH SKN CLS APL DERMABOND .7 (GAUZE/BANDAGES/DRESSINGS) ×1
APPLICATOR COTTON TIP 6IN STRL (MISCELLANEOUS) IMPLANT
BINDER ABD UNIV 10 28-50 (GAUZE/BANDAGES/DRESSINGS) ×1 IMPLANT
BINDER ABDOM UNIV 10 (GAUZE/BANDAGES/DRESSINGS) ×3
BLADE SURG 10 STRL SS (BLADE) ×6 IMPLANT
CABLE HIGH FREQUENCY MONO STRZ (ELECTRODE) IMPLANT
CATH ROBINSON RED A/P 16FR (CATHETERS) IMPLANT
CLOTH BEACON ORANGE TIMEOUT ST (SAFETY) ×3 IMPLANT
DERMABOND ADVANCED (GAUZE/BANDAGES/DRESSINGS) ×2
DERMABOND ADVANCED .7 DNX12 (GAUZE/BANDAGES/DRESSINGS) ×1 IMPLANT
DRSG OPSITE POSTOP 3X4 (GAUZE/BANDAGES/DRESSINGS) IMPLANT
DRSG OPSITE POSTOP 4X10 (GAUZE/BANDAGES/DRESSINGS) ×3 IMPLANT
DURAPREP 26ML APPLICATOR (WOUND CARE) ×3 IMPLANT
ELECT REM PT RETURN 9FT ADLT (ELECTROSURGICAL) ×3
ELECTRODE REM PT RTRN 9FT ADLT (ELECTROSURGICAL) ×1 IMPLANT
GLOVE BIO SURGEON STRL SZ 6.5 (GLOVE) ×4 IMPLANT
GLOVE BIO SURGEON STRL SZ7.5 (GLOVE) ×3 IMPLANT
GLOVE BIO SURGEONS STRL SZ 6.5 (GLOVE) ×2
GLOVE BIOGEL PI IND STRL 7.0 (GLOVE) ×2 IMPLANT
GLOVE BIOGEL PI INDICATOR 7.0 (GLOVE) ×4
GOWN STRL REUS W/TWL LRG LVL3 (GOWN DISPOSABLE) ×18 IMPLANT
HEMOSTAT SURGICEL 4X8 (HEMOSTASIS) ×3 IMPLANT
IV SOD CHL 0.9% 1000ML (IV SOLUTION) ×6 IMPLANT
IV STOPCOCK 4 WAY 40  W/Y SET (IV SOLUTION)
IV STOPCOCK 4 WAY 40 W/Y SET (IV SOLUTION) IMPLANT
MANIPULATOR UTERINE 4.5 ZUMI (MISCELLANEOUS) IMPLANT
PACK LAPAROSCOPY BASIN (CUSTOM PROCEDURE TRAY) IMPLANT
PACK TRENDGUARD 450 HYBRID PRO (MISCELLANEOUS) ×1 IMPLANT
PACK TRENDGUARD 600 HYBRD PROC (MISCELLANEOUS) IMPLANT
PENCIL BUTTON BLDE SNGL 10FT (ELECTRODE) ×3 IMPLANT
PENCIL BUTTON HOLSTER BLD 10FT (ELECTRODE) ×6 IMPLANT
PROTECTOR NERVE ULNAR (MISCELLANEOUS) ×3 IMPLANT
SET IRRIG TUBING LAPAROSCOPIC (IRRIGATION / IRRIGATOR) ×3 IMPLANT
SLEEVE XCEL OPT CAN 5 100 (ENDOMECHANICALS) ×3 IMPLANT
SOLUTION ANTI FOG 6CC (MISCELLANEOUS) ×3 IMPLANT
SPONGE LAP 18X18 X RAY DECT (DISPOSABLE) ×12 IMPLANT
SUT MNCRL AB 4-0 PS2 18 (SUTURE) ×12 IMPLANT
SUT PLAIN 2 0 (SUTURE) ×3
SUT PLAIN ABS 2-0 CT1 27XMFL (SUTURE) ×1 IMPLANT
SUT VIC AB 0 CT1 27 (SUTURE) ×9
SUT VIC AB 0 CT1 27XBRD ANBCTR (SUTURE) ×3 IMPLANT
SUT VIC AB 2-0 CT1 27 (SUTURE) ×3
SUT VIC AB 2-0 CT1 TAPERPNT 27 (SUTURE) ×1 IMPLANT
SUT VIC AB 3-0 CT1 27 (SUTURE) ×3
SUT VIC AB 3-0 CT1 TAPERPNT 27 (SUTURE) ×1 IMPLANT
SUT VICRYL 0 UR6 27IN ABS (SUTURE) ×6 IMPLANT
TAPE CLOTH SURG 4X10 WHT LF (GAUZE/BANDAGES/DRESSINGS) ×3 IMPLANT
TOWEL OR 17X24 6PK STRL BLUE (TOWEL DISPOSABLE) ×9 IMPLANT
TRAY FOLEY CATH SILVER 14FR (SET/KITS/TRAYS/PACK) ×3 IMPLANT
TRENDGUARD 450 HYBRID PRO PACK (MISCELLANEOUS) ×3
TRENDGUARD 600 HYBRID PROC PK (MISCELLANEOUS)
TROCAR BALLN 12MMX100 BLUNT (TROCAR) ×3 IMPLANT
TROCAR XCEL NON-BLD 11X100MML (ENDOMECHANICALS) ×3 IMPLANT
TROCAR XCEL NON-BLD 5MMX100MML (ENDOMECHANICALS) ×3 IMPLANT
YANKAUER SUCT BULB TIP NO VENT (SUCTIONS) ×3 IMPLANT

## 2017-06-28 NOTE — Anesthesia Postprocedure Evaluation (Signed)
Anesthesia Post Note  Patient: Kristine Madden  Procedure(s) Performed: Procedure(s) (LRB): VAGINAL EXAM UNDER ANESTHESIA,LAPAROSCOPY DIAGNOSTIC, EXPLORATORY LAPAROTOMY WITH EVACUATION OF HEMATOMA (N/A)     Patient location during evaluation: [facility] Anesthesia Type: General Level of consciousness: awake Pain management: satisfactory to patient Vital Signs Assessment: post-procedure vital signs reviewed and stable Respiratory status: spontaneous breathing Cardiovascular status: stable Anesthetic complications: no    Last Vitals:  Vitals:   06/28/17 0800 06/28/17 1200  BP: 135/78 (!) 103/59  Pulse: 92 98  Resp: 18 18  Temp: 36.9 C 36.7 C    Last Pain:  Vitals:   06/28/17 1200  TempSrc: Oral  PainSc:    Pain Goal: Patients Stated Pain Goal: 3 (06/28/17 0742)               Casimer Lanius

## 2017-06-28 NOTE — Addendum Note (Signed)
Addendum  created 06/28/17 1408 by Asher Muir, CRNA   Sign clinical note

## 2017-06-28 NOTE — Anesthesia Postprocedure Evaluation (Signed)
Anesthesia Post Note  Patient: YANEL DOMBROSKY  Procedure(s) Performed: Procedure(s) (LRB): VAGINAL EXAM UNDER ANESTHESIA,LAPAROSCOPY DIAGNOSTIC, EXPLORATORY LAPAROTOMY WITH EVACUATION OF HEMATOMA (N/A)     Patient location during evaluation: PACU Anesthesia Type: General Level of consciousness: awake and alert Pain management: pain level controlled Vital Signs Assessment: post-procedure vital signs reviewed and stable Respiratory status: spontaneous breathing, nonlabored ventilation and respiratory function stable Cardiovascular status: blood pressure returned to baseline and stable Postop Assessment: no signs of nausea or vomiting Anesthetic complications: no    Last Vitals:  Vitals:   06/28/17 0400 06/28/17 0430  BP: 131/70 138/84  Pulse: 91 94  Resp: 12 16  Temp:      Last Pain:  Vitals:   06/28/17 0430  TempSrc:   PainSc: Asleep   Pain Goal: Patients Stated Pain Goal: 3 (06/27/17 1640)               Lynda Rainwater

## 2017-06-28 NOTE — Transfer of Care (Signed)
2Immediate Anesthesia Transfer of Care Note  Patient: Kristine Madden  Procedure(s) Performed: Procedure(s): VAGINAL EXAM UNDER ANESTHESIA,LAPAROSCOPY DIAGNOSTIC, EXPLORATORY LAPAROTOMY WITH EVACUATION OF HEMATOMA (N/A)  Patient Location: PACU  Anesthesia Type:General  Level of Consciousness: awake, alert  and oriented  Airway & Oxygen Therapy: Patient Spontanous Breathing and Patient connected to nasal cannula oxygen  Post-op Assessment: Report given to RN and Post -op Vital signs reviewed and stable  Post vital signs: Reviewed and stable  Last Vitals:  Vitals:   06/27/17 2348 06/28/17 0320  BP: 116/66 (!) (P) 148/86  Pulse: 89 (P) 100  Resp: 20 (P) 17  Temp: 36.9 C (P) 36.7 C    Last Pain:  Vitals:   06/27/17 2348  TempSrc: Oral  PainSc:       Patients Stated Pain Goal: 3 (53/20/23 3435)  Complications: No apparent anesthesia complications

## 2017-06-28 NOTE — Anesthesia Preprocedure Evaluation (Signed)
Anesthesia Evaluation  Patient identified by MRN, date of birth, ID band Patient awake    Reviewed: Allergy & Precautions, NPO status , Patient's Chart, lab work & pertinent test results  Airway Mallampati: II  TM Distance: >3 FB Neck ROM: Full    Dental no notable dental hx. (+) Dental Advisory Given, Teeth Intact   Pulmonary pneumonia, Current Smoker,    Pulmonary exam normal breath sounds clear to auscultation       Cardiovascular negative cardio ROS Normal cardiovascular exam Rhythm:Regular Rate:Normal     Neuro/Psych PSYCHIATRIC DISORDERS Anxiety Depression negative neurological ROS     GI/Hepatic negative GI ROS, (+) Hepatitis -, C  Endo/Other  negative endocrine ROS  Renal/GU negative Renal ROS     Musculoskeletal  (+) Arthritis ,   Abdominal Normal abdominal exam  (+)   Peds  Hematology negative hematology ROS (+)   Anesthesia Other Findings   Reproductive/Obstetrics                             Anesthesia Physical  Anesthesia Plan  ASA: II and emergent  Anesthesia Plan: General   Post-op Pain Management:    Induction: Intravenous  PONV Risk Score and Plan: 4 or greater and Ondansetron, Dexamethasone, Propofol, Midazolam and Scopolamine patch - Pre-op  Airway Management Planned: Oral ETT  Additional Equipment:   Intra-op Plan:   Post-operative Plan: Extubation in OR  Informed Consent: I have reviewed the patients History and Physical, chart, labs and discussed the procedure including the risks, benefits and alternatives for the proposed anesthesia with the patient or authorized representative who has indicated his/her understanding and acceptance.     Plan Discussed with: CRNA and Surgeon  Anesthesia Plan Comments:         Anesthesia Quick Evaluation

## 2017-06-28 NOTE — Progress Notes (Signed)
POD#1 at 6 hours POp Vaginal exam under General Anesthesia/Dx LPS/Exploratory Laparotomy with evacuation of hematoma  Subjective: Patient reports:  Much less pain than prior to surgery in abdo-pelvic area.  Pain at Rt hip, but able to walk without problem.  No vaginal bleeding.  No gas passed yet.  No N/V.  Foley in place.  No palpitation.  No SOB.  No calf pain.  Objective: I have reviewed patient's vital signs.  vital signs, intake and output, medications and labs.  Vitals:   06/28/17 0600 06/28/17 0800  BP: (!) 149/77 135/78  Pulse: 90 92  Resp: 16 18  Temp: 98.8 F (37.1 C) 98.4 F (36.9 C)   I/O last 3 completed shifts: In: 4000 [I.V.:4000] Out: 2310 [Urine:1775; Other:25; Blood:510] Total I/O In: -  Out: 750 [Urine:750]  Results for orders placed or performed during the hospital encounter of 06/27/17 (from the past 24 hour(s))  CBC     Status: Abnormal   Collection Time: 06/27/17 12:16 PM  Result Value Ref Range   WBC 14.1 (H) 4.0 - 10.5 K/uL   RBC 4.33 3.87 - 5.11 MIL/uL   Hemoglobin 13.6 12.0 - 15.0 g/dL   HCT 39.7 36.0 - 46.0 %   MCV 91.7 78.0 - 100.0 fL   MCH 31.4 26.0 - 34.0 pg   MCHC 34.3 30.0 - 36.0 g/dL   RDW 14.3 11.5 - 15.5 %   Platelets 497 (H) 150 - 400 K/uL  Type and screen [place]     Status: None   Collection Time: 06/27/17 12:16 PM  Result Value Ref Range   ABO/RH(D) O NEG    Antibody Screen NEG    Sample Expiration 06/30/2017   CBC     Status: Abnormal   Collection Time: 06/27/17 11:08 PM  Result Value Ref Range   WBC 20.0 (H) 4.0 - 10.5 K/uL   RBC 4.07 3.87 - 5.11 MIL/uL   Hemoglobin 12.8 12.0 - 15.0 g/dL   HCT 37.5 36.0 - 46.0 %   MCV 92.1 78.0 - 100.0 fL   MCH 31.4 26.0 - 34.0 pg   MCHC 34.1 30.0 - 36.0 g/dL   RDW 14.5 11.5 - 15.5 %   Platelets 498 (H) 150 - 400 K/uL  CBC     Status: Abnormal   Collection Time: 06/28/17  3:56 AM  Result Value Ref Range   WBC 42.2 (H) 4.0 - 10.5 K/uL   RBC 3.32 (L) 3.87 - 5.11  MIL/uL   Hemoglobin 10.3 (L) 12.0 - 15.0 g/dL   HCT 30.3 (L) 36.0 - 46.0 %   MCV 91.3 78.0 - 100.0 fL   MCH 31.0 26.0 - 34.0 pg   MCHC 34.0 30.0 - 36.0 g/dL   RDW 14.4 11.5 - 15.5 %   Platelets 434 (H) 150 - 400 K/uL  DIC (disseminated intravasc coag) panel     Status: Abnormal   Collection Time: 06/28/17  3:56 AM  Result Value Ref Range   Prothrombin Time 14.2 11.4 - 15.2 seconds   INR 1.09    aPTT 29 24 - 36 seconds   Fibrinogen 432 210 - 475 mg/dL   D-Dimer, Quant 4.21 (H) 0.00 - 0.50 ug/mL-FEU   Platelets 434 (H) 150 - 400 K/uL   Smear Review NO SCHISTOCYTES SEEN     EXAM General: alert and cooperative Resp: clear to auscultation bilaterally Cardio: regular rate and rhythm GI: soft, non-tender; bowel sounds normal; no masses,  no organomegaly Extremities:  No oedema, no erythema, NT. Vaginal Bleeding: none on exam.  Very light browning d/c on pad.  Assessment: s/p Procedure(s): VAGINAL EXAM UNDER ANESTHESIA,LAPAROSCOPY DIAGNOSTIC, EXPLORATORY LAPAROTOMY WITH EVACUATION OF HEMATOMA: stable and progressing well.  Afebrile/Good diuresis/No significant ileus/No vaginal bleeding.  Drain with only 25 cc x surgery.  Surgical findings and management plan discussed with patient.  Plan: Clear liquids.  Repeat CBC now.  Remove Foley.  Continue IV ABTx with Clinda/Genta.  LOS: 0 days    [name], MD 06/28/2017 10:17 AM    06/28/2017, 10:17 AM

## 2017-06-28 NOTE — Op Note (Signed)
Operative Note  06/27/2017 - 06/28/2017  3:53 AM  PATIENT:  Estill Bamberg  51 y.o. female  PRE-OPERATIVE DIAGNOSIS:  Post Op Bleeding  POST-OPERATIVE DIAGNOSIS:  Post Op Bleeding, Infected Pelvic Hematoma  PROCEDURE:  Procedure(s): VAGINAL EXAM UNDER GENERAL ANESTHESIA, DIAGNOSTIC LAPAROSCOPY, EXPLORATORY LAPAROTOMY WITH EVACUATION OF HEMATOMA  SURGEON:  Surgeon(s): [name], MD Everlene Farrier, MD Coralie Keens, MD  ANESTHESIA:   general  FINDINGS:  Abdominopelvic Infected Hematoma,  Bowel Adhesions to Vaginal cuff and pelvic walls.  DESCRIPTION OF OPERATION:  Under general anesthesia with endotracheal intubation the patient is in lithotomy position. She is prepped with DuraPrep on the abdomen and with Betadine on the suprapubic, vulvar and vaginal areas. She is draped as usual. A timeout is done. Patient was given 300 mg of gentamicin IV and 900 mg of clindamycin IV as surgical prophylaxis at the beginning of the procedure. A Foley is inserted in the bladder.  The vaginal exam under general anesthesia reveals an intact cuff which was repaired earlier today, with no active bleeding.  The decision was therefore taken to proceed with diagnostic laparoscopy.  Dr. Everlene Farrier kindly accepted to assist the case and is called in while getting ready for laparoscopy.        An incision is made with the scalpel at the supra umbilical area, at the same site as the original surgery.  The aponeurosis is opened bluntly with the finger at the level of the pursestring stitch.  A new pursestring stitch with Vicryl 0 is done at the level of the aponeurosis.  The Sheryle Hail is inserted under direct vision and the balloon is inflated.  A pneumoperitoneum is created with CO2.  The camera is inserted at that level.  A mild ileus is present.  We reopen the previous lower robotic incisions on the right and the left with the scalpel.  A 5 mm port is inserted on the right and a 10 mm port on the left,  both under direct vision.  We note a large hematoma in the left gutter which is suctioned with a large laparoscopic suction.  Large clots were present, suggesting that the bleeding was fairly recent. In the pelvic cavity, the bowels are adherent to the vaginal cuff and both pelvic walls, completely preventing any visualization of the surgical sites, including the vaginal cuff and any pedicles.  After removing the large blood clots, the large suction is switched back to the [place].  We irrigate and suction to assess whether or not active bleeding is present and if so where it is coming from.  We also stopped the CO2 and evacuated the pneumoperitoneum to decrease the pressure in the abdominopelvic cavity and then re-created the pneumoperitoneum and inspected the abdomino-pelvic cavity for any fresh blood.  We found only a small amount of red fluid in the pelvis with uncertainty as to whether it was fresh bleeding or not and with no identification of any bleeder.  We therefore made the decision to proceed with exploratory laparotomy.  All laparoscopic instruments were removed under direct vision. The ports were removed. The CO2 was evacuated.  The pursestring stitch was attached at the supraumbilical incision to close the aponeurosis.  The aponeurosis was closed at both the right and left assistant ports with a Vicryl 0.  A figure-of-eight was done at the smaller right incision and a running suture was done at the 10 mm port incision on the left.  The adipose tissue was reapproximated with a Monocryl 4-0  at all incisions. The skin was closed with separate stitches of Monocryl 4-0 at all sites.        Given the extent of bowel adhesion in the pelvis, the decision was made to request a general surgery intraoperative consultation.  Dr. Ninfa Linden who was covering, promptly came in and scrubbed for the exploratory laparotomy.      A Pfannenstiel incision was made with the scalpel.  The adipose tissue was opened with the  Bovie.  The aponeurosis was opened with the Bovie and extended with Mayo scissors transversely.  The aponeurosis was grasped with cockers and the recti muscles were separated from the aponeurosis on the midline superiorly and inferiorly.  Dr. Ninfa Linden arrived at that point and took over from Dr. Gaetano Net, who left the OR. The parietal peritoneum was opened with Metzenbaums scissors after grasping it with hemostats.  The parietal peritoneum was opened longitudinally.  Dr. Ninfa Linden inspected the bowels, which appeared normal in the abdomen.  He then explored the pelvic cavity and released the bowels manually.  An organized hematoma with mild odor and on some early necrosis were found at the vaginal cough.  Blood clots were suctioned and evacuated manually.  No discrete bleeder was found after thorough inspection of the vaginal cuff, the uterine artery areas on either side, at the level of the round ligaments or around the ovaries.  Both tubes had been removed at the time of total laparoscopic hysterectomy.  Both ovaries were well visualized and no active bleeding was found at those levels.  The mouth of the small blood vessel close to the left ovary was seen with no visible bleeding, but was nonetheless well cauterized with the Bovie as a precaution.  The site of the original vaginal cuff hematoma, which was getting infected (bad odor) showed inflamation and early necrosis.  That inflamed pelvic bed, from which the adherent bowels were released by Dr Ninfa Linden, was diffusely oozing, which, we had to conclude, was the origin of the bleeding.  We irrigated abundantly and suctioned.  No active bleeding was identified.  Surgicel was applied to the whole area and a lap was inserted with pressure x 5 minutes.  When the lap was removed, the abdomino-pelvic cavities remained dry.  A Pratt drain was inserted and fixed to the skin at the lower right abdomen.  The parietal peritoneum was closed with a Vicryl 2-0 in a running  suture.  Hemostasis was completed with the Bovie on the recti muscles.  The aponeurosis was closed with 2 half running sutures of Vicryl 0.  Separate stitches of plain 2-0 were done on the adipose tissue.  The skin was closed with a subcuticular suture of Monocryl 4-0.  A compressive dressing was applied on the incision.        Inspection of the vagina revealed an intact vaginal cuff, no bleeding, no blood clot.  The patient was brought to recovery room in good and stable status         ESTIMATED BLOOD LOSS: 10 cc  EVACUATED HEMATOMA:  500 cc   Intake/Output Summary (Last 24 hours) at 06/28/17 0353 Last data filed at 06/28/17 0330  Gross per 24 hour  Intake             3800 ml  Output             1485 ml  Net             2315 ml     BLOOD ADMINISTERED:none  LOCAL MEDICATIONS USED:  NONE  SPECIMEN:  Source of Specimen:  None  DISPOSITION OF SPECIMEN:  N/A  COUNTS:  YES  PLAN OF CARE: Transfer to PACU  Marie-Lyne LavoieMD3:53 AM

## 2017-06-28 NOTE — Progress Notes (Signed)
  SOLARA GOODCHILD [birth year] [id number]  Post Vaginal cuff repair earlier today. Stable Postop until got up to go to the bathroom.  Started having vaginal bleeding again at that time. 2 Pads with overflow of blood. Ate solids at 8 pm.  Just vomited.  Exam:  Vitals:   06/27/17 1731 06/27/17 2130 06/27/17 2300 06/27/17 2348  BP: (!) 124/58 130/87 120/84 116/66  Pulse: 69 77 (!) 108 89  Resp: 16 18  20   Temp: 98.4 F (36.9 C) 99 F (37.2 C) 99.8 F (37.7 C) 98.5 F (36.9 C)  TempSrc: Oral Oral  Oral  SpO2: 99% 100%  100%  Weight:      Height:       General appearance:  Normal, but c/o increased pain in midline lower abdomen  Abdo:  Tender lower abdo  Gyn exam:  Moderate vaginal bleeding with blood at vulva.                    Will complete Gyn exam in OR  Results for MAHDIYA, MOSSBERG (MRN [id number]) as of 06/28/2017 00:05  Ref. Range 06/27/2017 12:16 06/27/2017 23:08  WBC Latest Ref Range: 4.0 - 10.5 K/uL 14.1 (H) 20.0 (H)  RBC Latest Ref Range: 3.87 - 5.11 MIL/uL 4.33 4.07  Hemoglobin Latest Ref Range: 12.0 - 15.0 g/dL 13.6 12.8  HCT Latest Ref Range: 36.0 - 46.0 % 39.7 37.5  MCV Latest Ref Range: 78.0 - 100.0 fL 91.7 92.1  MCH Latest Ref Range: 26.0 - 34.0 pg 31.4 31.4  MCHC Latest Ref Range: 30.0 - 36.0 g/dL 34.3 34.1  RDW Latest Ref Range: 11.5 - 15.5 % 14.3 14.5  Platelets Latest Ref Range: 150 - 400 K/uL 497 (H) 498 (H)    Assessment/Plan:  51 y.o.   Post Vaginal cuff repair with vaginal bleeding again.  Hemodynamically stable.  Hb 12.8. Decision to do a Gynecologic exam in OR under sedation vs General anesthesia.  Possible Vaginal cuff repair/packing.  Possible Dx Laparoscopy, possible Laparotomy.  Informed consent signed.  Typed and screened.  All Pen.  Genta/Clinda Prophylaxis.  [name] MD, 12:05 AM 06/28/2017

## 2017-06-28 NOTE — Op Note (Signed)
VAGINAL EXAM UNDER ANESTHESIA,LAPAROSCOPY DIAGNOSTIC, EXPLORATORY LAPAROTOMY WITH EVACUATION OF HEMATOMA  Procedure Note  Kristine Madden 06/27/2017 - 06/28/2017   Pre-op Diagnosis: Post Op Bleeding     Post-op Diagnosis: same  Procedure(s): VAGINAL EXAM UNDER ANESTHESIA,LAPAROSCOPY DIAGNOSTIC, EXPLORATORY LAPAROTOMY WITH EVACUATION OF HEMATOMA  Surgeon(s): [name], MD Everlene Farrier, MD Coralie Keens, MD  Anesthesia: General  Staff:  Circulator: Nolen Mu, RN Scrub Person: Valentino Nose C  Intraoperative consult: This is a 51 year old female who is undergoing an exploratory laparotomy for delayed postoperative bleeding. General surgical consultation has been requested. She is 10-11 days status post laparoscopic hysterectomy.  Findings: The patient was found to have a pelvic hematoma which was probably secondarily infected. There was mild oozing from the raw surfaces but no overt bleeding vessel. No evidence of bowel injury was identified.  Procedure: The patient was already in the operating room under general anesthesia. Dr. Dellis Filbert had already started the laparotomy through a Pfannenstiel incision. The fascia was opened up in the midline was identified. She then opened up the peritoneum. Upon exploring the pelvis, there was some bowel loosely adherent to a hematoma. The hematoma had a slightly infected smell. There was no gross purulence. There was some small bowel slightly adherent to the pelvis and a small inflammatory rind. We evacuated the hematoma and irrigated the pelvis. There was slight oozing from the pelvic surface but no grossly bleeding large vessel. After thoroughly explained operative site, we irrigated with saline. We placed a large piece of Surgicel into the pelvis. We then packed the pelvis. A few minutes later, remove the packing and saw no evidence of ongoing bleeding. At this point, a small incision was made in the right lower quadrant and we placed  a Jackson-Pratt drain through this and into the pelvis at the area of the evacuated hematoma. This was then sutured in place. Again, after further evaluation, hemostasis appeared to be achieved. We then closed the peritoneum and the fascia. Dr. Dellis Filbert then completed the rest of the closure. FOR correct at the end of the procedure.          [name] A   Date: 06/28/2017  Time: 2:51 AM

## 2017-06-29 ENCOUNTER — Encounter (HOSPITAL_COMMUNITY): Payer: Self-pay | Admitting: Obstetrics & Gynecology

## 2017-06-29 DIAGNOSIS — C541 Malignant neoplasm of endometrium: Secondary | ICD-10-CM | POA: Insufficient documentation

## 2017-06-29 DIAGNOSIS — T814XXS Infection following a procedure, sequela: Secondary | ICD-10-CM

## 2017-06-29 DIAGNOSIS — K9189 Other postprocedural complications and disorders of digestive system: Secondary | ICD-10-CM

## 2017-06-29 MED ORDER — CIPROFLOXACIN HCL 250 MG PO TABS
250.0000 mg | ORAL_TABLET | Freq: Two times a day (BID) | ORAL | Status: DC
  Administered 2017-06-29 – 2017-06-30 (×3): 250 mg via ORAL
  Filled 2017-06-29 (×5): qty 1

## 2017-06-29 MED ORDER — CLINDAMYCIN HCL 300 MG PO CAPS
300.0000 mg | ORAL_CAPSULE | Freq: Three times a day (TID) | ORAL | Status: DC
  Administered 2017-06-29 – 2017-06-30 (×4): 300 mg via ORAL
  Filled 2017-06-29 (×7): qty 1

## 2017-06-29 MED ORDER — ONDANSETRON HCL 4 MG/2ML IJ SOLN: 4.0000 mg | Freq: Three times a day (TID) | INTRAMUSCULAR | Status: DC | PRN

## 2017-06-29 MED ORDER — ONDANSETRON HCL 4 MG/2ML IJ SOLN
4.0000 mg | Freq: Three times a day (TID) | INTRAMUSCULAR | Status: DC
  Administered 2017-06-29 (×2): 4 mg via INTRAVENOUS
  Filled 2017-06-29 (×2): qty 2

## 2017-06-29 NOTE — Progress Notes (Signed)
1 Day Post-Op   Subjective/Chief Complaint: Complains on nausea Not passing flatus yet   Objective: Vital signs in last 24 hours: Temp:  [98 F (36.7 C)-99.8 F (37.7 C)] 98.7 F (37.1 C) (07/08 0050) Pulse Rate:  [92-98] 97 (07/08 0050) Resp:  [18] 18 (07/08 0050) BP: (103-135)/(59-78) 133/75 (07/08 0050) SpO2:  [96 %-98 %] 97 % (07/08 0050) Last BM Date: 06/27/17  Intake/Output from previous day: 07/07 0701 - 07/08 0700 In: -  Out: 2360 [Urine:2350; Drains:10] Intake/Output this shift: Total I/O In: -  Out: 4920 [Urine:1100; Drains:5]  Exam: Abdomen soft, drain serosang. Dressing dry  Lab Results:   Recent Labs  06/28/17 0356 06/28/17 1136  WBC 42.2* 36.0*  HGB 10.3* 10.0*  HCT 30.3* 29.3*  PLT 434*  434* 428*   BMET No results for input(s): NA, K, CL, CO2, GLUCOSE, BUN, CREATININE, CALCIUM in the last 72 hours. PT/INR  Recent Labs  06/28/17 0356  LABPROT 14.2  INR 1.09   ABG No results for input(s): PHART, HCO3 in the last 72 hours.  Invalid input(s): PCO2, PO2  Studies/Results: No results found.  Anti-infectives: Anti-infectives    Start     Dose/Rate Route Frequency Ordered Stop   06/29/17 0200  gentamicin (GARAMYCIN) 300 mg in dextrose 5 % 50 mL IVPB     5 mg/kg  60.2 kg 115 mL/hr over 30 Minutes Intravenous  Once 06/28/17 0507 06/29/17 0230   06/28/17 1000  clindamycin (CLEOCIN) IVPB 900 mg     900 mg 100 mL/hr over 30 Minutes Intravenous Every 8 hours 06/28/17 0507 06/29/17 1359   06/28/17 0030  gentamicin (GARAMYCIN) 300 mg, clindamycin (CLEOCIN) 900 mg in dextrose 5 % 100 mL IVPB     227 mL/hr over 30 Minutes Intravenous  Once 06/28/17 0029 06/28/17 0039   06/28/17 0015  clindamycin (CLEOCIN) IVPB 900 mg  Status:  Discontinued     900 mg 100 mL/hr over 30 Minutes Intravenous  Once 06/28/17 0005 06/28/17 0029   06/28/17 0015  gentamicin (GARAMYCIN) IVPB 100 mg  Status:  Discontinued     100 mg 200 mL/hr over 30 Minutes  Intravenous  Once 06/28/17 0005 06/28/17 0052   06/27/17 2200  metroNIDAZOLE (FLAGYL) tablet 500 mg  Status:  Discontinued     500 mg Oral Every 12 hours 06/27/17 1649 06/28/17 0507   06/27/17 1330  metroNIDAZOLE (FLAGYL) IVPB 500 mg     500 mg 100 mL/hr over 60 Minutes Intravenous  Once 06/27/17 1317 06/27/17 1330      Assessment/Plan: s/p Procedure(s): VAGINAL EXAM UNDER ANESTHESIA,LAPAROSCOPY DIAGNOSTIC, EXPLORATORY LAPAROTOMY WITH EVACUATION OF HEMATOMA (N/Madden)  Patient stable Minimal drainage.  The drain can be removed at discharge. No signs of ongoing bleeding.  LOS: 1 day    Kristine Madden 06/29/2017

## 2017-06-29 NOTE — Progress Notes (Signed)
POD #2 Vaginal exam under General Anesthesia/Dx LPS/Exploratory Laparotomy with evacuation of hematoma  Subjective: Patient reports nausea and no problems voiding.  No vomiting on clear diet. Not feeling gas yet.  Walking the hallways.  No vaginal bleeding.    Objective: I have reviewed patient's vital signs.  vital signs, intake and output, medications and labs.  Vitals:   06/29/17 0050 06/29/17 0800  BP: 133/75 (!) 148/76  Pulse: 97 97  Resp: 18 18  Temp: 98.7 F (37.1 C) 98.1 F (36.7 C)   I/O last 3 completed shifts: In: 2900 [I.V.:2900] Out: 1287 [Urine:3900; Drains:10; Other:25; Blood:500] Total I/O In: -  Out: 59 [Urine:600]  Results for orders placed or performed during the hospital encounter of 06/27/17 (from the past 24 hour(s))  CBC with Differential/Platelet     Status: Abnormal   Collection Time: 06/28/17 11:36 AM  Result Value Ref Range   WBC 36.0 (H) 4.0 - 10.5 K/uL   RBC 3.19 (L) 3.87 - 5.11 MIL/uL   Hemoglobin 10.0 (L) 12.0 - 15.0 g/dL   HCT 29.3 (L) 36.0 - 46.0 %   MCV 91.8 78.0 - 100.0 fL   MCH 31.3 26.0 - 34.0 pg   MCHC 34.1 30.0 - 36.0 g/dL   RDW 14.3 11.5 - 15.5 %   Platelets 428 (H) 150 - 400 K/uL   Neutrophils Relative % 85 %   Lymphocytes Relative 9 %   Monocytes Relative 6 %   Eosinophils Relative 0 %   Basophils Relative 0 %   Band Neutrophils 0 %   Metamyelocytes Relative 0 %   Myelocytes 0 %   Promyelocytes Absolute 0 %   Blasts 0 %   nRBC 0 0 /100 WBC   Other 0 %   Neutro Abs 30.6 (H) 1.7 - 7.7 K/uL   Lymphs Abs 3.2 0.7 - 4.0 K/uL   Monocytes Absolute 2.2 (H) 0.1 - 1.0 K/uL   Eosinophils Absolute 0.0 0.0 - 0.7 K/uL   Basophils Absolute 0.0 0.0 - 0.1 K/uL    EXAM General: alert and cooperative Resp: clear to auscultation bilaterally Cardio: regular rate and rhythm GI: soft, non-tender; bowel sounds normal; no masses,  no organomegaly .  Not distended. Extremities: no edema, redness or tenderness in the calves or  thighs Vaginal Bleeding: none  Assessment: s/p Procedure(s): VAGINAL EXAM UNDER ANESTHESIA,LAPAROSCOPY DIAGNOSTIC, EXPLORATORY LAPAROTOMY WITH EVACUATION OF HEMATOMA: progressing well and Nausea, but no vomiting.   Afebrile.  No Ileus.  Minimal fluid, not bloody, in drain.  Plan: Advance diet D/C IV ABTx, start PO ABTx  Zofran PRN. Repeat CBC tomorrow am.  Will remove drain tomorrow.   Planning d/c home tomorrow if tolerates regular diet and continues to progress well.  LOS: 1 day    Kristine Bruins, MD 06/29/2017 9:51 AM    06/29/2017, 9:51 AM

## 2017-06-30 ENCOUNTER — Inpatient Hospital Stay (HOSPITAL_COMMUNITY): Payer: BLUE CROSS/BLUE SHIELD

## 2017-06-30 DIAGNOSIS — K9189 Other postprocedural complications and disorders of digestive system: Secondary | ICD-10-CM

## 2017-06-30 DIAGNOSIS — T814XXS Infection following a procedure, sequela: Secondary | ICD-10-CM

## 2017-06-30 LAB — CBC WITH DIFFERENTIAL/PLATELET
BASOS ABS: 0 10*3/uL (ref 0.0–0.1)
BASOS PCT: 0 %
EOS ABS: 0.4 10*3/uL (ref 0.0–0.7)
EOS PCT: 1 %
HEMATOCRIT: 26.4 % — AB (ref 36.0–46.0)
Hemoglobin: 9.2 g/dL — ABNORMAL LOW (ref 12.0–15.0)
Lymphocytes Relative: 8 %
Lymphs Abs: 2.6 10*3/uL (ref 0.7–4.0)
MCH: 31.5 pg (ref 26.0–34.0)
MCHC: 34.8 g/dL (ref 30.0–36.0)
MCV: 90.4 fL (ref 78.0–100.0)
MONO ABS: 1.5 10*3/uL — AB (ref 0.1–1.0)
MONOS PCT: 5 %
NEUTROS ABS: 26.6 10*3/uL — AB (ref 1.7–7.7)
Neutrophils Relative %: 86 %
PLATELETS: 382 10*3/uL (ref 150–400)
RBC: 2.92 MIL/uL — ABNORMAL LOW (ref 3.87–5.11)
RDW: 14.5 % (ref 11.5–15.5)
WBC: 31.1 10*3/uL — ABNORMAL HIGH (ref 4.0–10.5)

## 2017-06-30 MED ORDER — ONDANSETRON HCL 4 MG PO TABS
4.0000 mg | ORAL_TABLET | Freq: Three times a day (TID) | ORAL | Status: DC | PRN
  Administered 2017-06-30 – 2017-07-04 (×2): 4 mg via ORAL
  Filled 2017-06-30 (×2): qty 1

## 2017-06-30 MED ORDER — ONDANSETRON HCL 4 MG/5ML PO SOLN
4.0000 mg | Freq: Three times a day (TID) | ORAL | Status: DC | PRN
  Filled 2017-06-30: qty 5

## 2017-06-30 MED ORDER — GENTAMICIN SULFATE 40 MG/ML IJ SOLN
2.5000 mg/kg | Freq: Two times a day (BID) | INTRAVENOUS | Status: DC
  Administered 2017-06-30 – 2017-07-03 (×6): 150 mg via INTRAVENOUS
  Filled 2017-06-30 (×6): qty 3.75

## 2017-06-30 MED ORDER — DEXTROSE IN LACTATED RINGERS 5 % IV SOLN
INTRAVENOUS | Status: DC
  Administered 2017-06-30 – 2017-07-03 (×7): via INTRAVENOUS

## 2017-06-30 MED ORDER — FLEET ENEMA 7-19 GM/118ML RE ENEM
1.0000 | ENEMA | Freq: Once | RECTAL | Status: AC
  Administered 2017-06-30: 1 via RECTAL

## 2017-06-30 MED ORDER — MAGNESIUM HYDROXIDE 400 MG/5ML PO SUSP
30.0000 mL | ORAL | Status: DC | PRN
  Administered 2017-06-30: 30 mL via ORAL
  Filled 2017-06-30: qty 30

## 2017-06-30 MED ORDER — LORAZEPAM 2 MG/ML IJ SOLN
0.5000 mg | Freq: Four times a day (QID) | INTRAMUSCULAR | Status: DC | PRN
  Administered 2017-06-30 – 2017-07-02 (×3): 0.5 mg via INTRAMUSCULAR
  Filled 2017-06-30 (×6): qty 0.25

## 2017-06-30 MED ORDER — ALPRAZOLAM 0.5 MG PO TABS: 0.2500 mg | ORAL_TABLET | Freq: Three times a day (TID) | ORAL | Status: DC | PRN

## 2017-06-30 MED ORDER — ZOLPIDEM TARTRATE 5 MG PO TABS: 5.0000 mg | ORAL_TABLET | Freq: Every evening | ORAL | Status: DC | PRN

## 2017-06-30 MED ORDER — CLINDAMYCIN PHOSPHATE 900 MG/50ML IV SOLN
900.0000 mg | Freq: Three times a day (TID) | INTRAVENOUS | Status: DC
  Administered 2017-06-30 – 2017-07-03 (×8): 900 mg via INTRAVENOUS
  Filled 2017-06-30 (×9): qty 50

## 2017-06-30 NOTE — Progress Notes (Signed)
POD#3 Vaginal exam under General Anesthesia/Dx LPS/Exploratory Laparotomy with evacuation of hematoma. Subjective: Patient reports:  Very little PO fluids today.  No vomiting.  Not passing gas.  Only had a small amount of stools/gas after a Fleet enema.  No vaginal bleeding.  Mictions normal.  Objective: I have reviewed patient's vital signs.  vital signs, intake and output, medications and labs.  Vitals:   06/30/17 1154 06/30/17 1605  BP: 119/70 133/71  Pulse: 93 84  Resp: 16 18  Temp: 98.4 F (36.9 C) 98.2 F (36.8 C)   I/O last 3 completed shifts: In: 1987 [P.O.:720; I.V.:1267] Out: 1962.5 [Urine:1900; Drains:62.5] No intake/output data recorded.  Results for orders placed or performed during the hospital encounter of 06/27/17 (from the past 24 hour(s))  CBC with Differential/Platelet     Status: Abnormal   Collection Time: 06/30/17  6:08 AM  Result Value Ref Range   WBC 31.1 (H) 4.0 - 10.5 K/uL   RBC 2.92 (L) 3.87 - 5.11 MIL/uL   Hemoglobin 9.2 (L) 12.0 - 15.0 g/dL   HCT 26.4 (L) 36.0 - 46.0 %   MCV 90.4 78.0 - 100.0 fL   MCH 31.5 26.0 - 34.0 pg   MCHC 34.8 30.0 - 36.0 g/dL   RDW 14.5 11.5 - 15.5 %   Platelets 382 150 - 400 K/uL   Neutrophils Relative % 86 %   Neutro Abs 26.6 (H) 1.7 - 7.7 K/uL   Lymphocytes Relative 8 %   Lymphs Abs 2.6 0.7 - 4.0 K/uL   Monocytes Relative 5 %   Monocytes Absolute 1.5 (H) 0.1 - 1.0 K/uL   Eosinophils Relative 1 %   Eosinophils Absolute 0.4 0.0 - 0.7 K/uL   Basophils Relative 0 %   Basophils Absolute 0.0 0.0 - 0.1 K/uL    EXAM General: alert and cooperative Resp: clear to auscultation bilaterally Cardio: regular rate and rhythm GI: incision: intact.  Distended.  BS present, but some high pitch. Extremities: no edema, redness or tenderness in the calves or thighs Vaginal Bleeding: none  Assessment: s/p Procedure(s): VAGINAL EXAM UNDER ANESTHESIA,LAPAROSCOPY DIAGNOSTIC, EXPLORATORY LAPAROTOMY WITH EVACUATION OF HEMATOMA:  ileus present.  Hemodynamically stable.  Hb stable.  Afebrile, but WBCs still 31.1.  Anxiety/Lack of sleep.  Plan: NPO.  Insert NG tube.  Xanax/Ambien PRN.  Back to ABTx IV.  Ambulation.  XRay of Abdomen.  LOS: 2 days    [name], MD 06/30/2017 4:28 PM    06/30/2017, 4:28 PM

## 2017-06-30 NOTE — Progress Notes (Signed)
Pt to radiology via wheelchair by NT.

## 2017-06-30 NOTE — Progress Notes (Signed)
Pt informed Dr. Dellis Filbert called unit, will be into see her after office hours.  MD informed pt still not passing gas.

## 2017-07-01 ENCOUNTER — Inpatient Hospital Stay (HOSPITAL_COMMUNITY): Payer: BLUE CROSS/BLUE SHIELD

## 2017-07-01 ENCOUNTER — Encounter (HOSPITAL_COMMUNITY): Payer: Self-pay

## 2017-07-01 DIAGNOSIS — K9189 Other postprocedural complications and disorders of digestive system: Secondary | ICD-10-CM

## 2017-07-01 DIAGNOSIS — T814XXS Infection following a procedure, sequela: Secondary | ICD-10-CM

## 2017-07-01 LAB — CBC WITH DIFFERENTIAL/PLATELET
Basophils Absolute: 0 10*3/uL (ref 0.0–0.1)
Basophils Relative: 0 %
Eosinophils Absolute: 0.3 10*3/uL (ref 0.0–0.7)
Eosinophils Relative: 1 %
HEMATOCRIT: 27.7 % — AB (ref 36.0–46.0)
HEMOGLOBIN: 9.6 g/dL — AB (ref 12.0–15.0)
LYMPHS ABS: 1.2 10*3/uL (ref 0.7–4.0)
LYMPHS PCT: 5 %
MCH: 31.1 pg (ref 26.0–34.0)
MCHC: 34.7 g/dL (ref 30.0–36.0)
MCV: 89.6 fL (ref 78.0–100.0)
Monocytes Absolute: 2.1 10*3/uL — ABNORMAL HIGH (ref 0.1–1.0)
Monocytes Relative: 9 %
NEUTROS ABS: 20.9 10*3/uL — AB (ref 1.7–7.7)
NEUTROS PCT: 85 %
Platelets: 473 10*3/uL — ABNORMAL HIGH (ref 150–400)
RBC: 3.09 MIL/uL — AB (ref 3.87–5.11)
RDW: 14.5 % (ref 11.5–15.5)
WBC: 24.4 10*3/uL — AB (ref 4.0–10.5)

## 2017-07-01 MED ORDER — IOPAMIDOL (ISOVUE-300) INJECTION 61%
100.0000 mL | Freq: Once | INTRAVENOUS | Status: AC | PRN
  Administered 2017-07-01: 100 mL via INTRAVENOUS

## 2017-07-01 NOTE — Progress Notes (Signed)
Dr. Redmond Pulling informed of increased JP drain output. No new orders at this time. Told to keep monitoring.

## 2017-07-01 NOTE — Progress Notes (Signed)
POD#4  Pop Ileus NG tube. Subjective: Patient reports:  Starting to pass gas this morning.  Feels her abdomen less tensed.  No N/V.  Very mild brown d/c vaginally.  Good mictions.  No chill.  Objective: I have reviewed patient's vital signs.  vital signs, intake and output, medications and labs.  Vitals:   07/01/17 0505 07/01/17 0800  BP: 125/76 132/87  Pulse: 98 99  Resp: 17 18  Temp: 99.7 F (37.6 C) 98.9 F (37.2 C)   I/O last 3 completed shifts: In: 1987 [P.O.:720; I.V.:1267] Out: 1377.5 [Urine:200; Emesis/NG output:250; Drains:907.5; Other:20] Total I/O In: -  Out: 40 [Emesis/NG output:40]  No results found for this or any previous visit (from the past 24 hour(s)).  EXAM General: alert and cooperative Resp: clear to auscultation bilaterally Cardio: regular rate and rhythm GI:  Distended, but depressible (improved x NG tube in).  BS present, no high pitch.  Incisions intact. Extremities: no edema, redness or tenderness in the calves or thighs Vaginal Bleeding: minimal   2 views DG Abdo: Multiple loops of dilated small bowel up to 4.4 cm with multiple fluid levels. Mild gaseous enlargement of the colon with some gas present in the rectum. Findings could be secondary to ileus although partial or developing bowel obstruction is also in the differential given radiographic appearance.  Assessment: s/p Procedure(s): VAGINAL EXAM UNDER ANESTHESIA,LAPAROSCOPY DIAGNOSTIC, EXPLORATORY LAPAROTOMY WITH EVACUATION OF HEMATOMA: ileus present/Starting to pass gas this am, no evidence of obstruction clinically at this time.  Subfebrile.  Less output from NG tube this am.  Jackson-Pratt draining more overnight, pink serous fluid.  Plan: NPO.  Will repeat CBC/diff.  Observing sign of infection closely, may have to change ABTx.  LOS: 3 days    [name], MD 07/01/2017 8:31 AM    07/01/2017, 8:31 AM

## 2017-07-02 DIAGNOSIS — T814XXS Infection following a procedure, sequela: Secondary | ICD-10-CM

## 2017-07-02 DIAGNOSIS — K9189 Other postprocedural complications and disorders of digestive system: Secondary | ICD-10-CM

## 2017-07-02 LAB — CBC WITH DIFFERENTIAL/PLATELET
BASOS ABS: 0 10*3/uL (ref 0.0–0.1)
Basophils Relative: 0 %
EOS PCT: 3 %
Eosinophils Absolute: 0.6 10*3/uL (ref 0.0–0.7)
HEMATOCRIT: 24.8 % — AB (ref 36.0–46.0)
HEMOGLOBIN: 8.7 g/dL — AB (ref 12.0–15.0)
LYMPHS ABS: 1.4 10*3/uL (ref 0.7–4.0)
LYMPHS PCT: 6 %
MCH: 31.6 pg (ref 26.0–34.0)
MCHC: 35.1 g/dL (ref 30.0–36.0)
MCV: 90.2 fL (ref 78.0–100.0)
Monocytes Absolute: 2.7 10*3/uL — ABNORMAL HIGH (ref 0.1–1.0)
Monocytes Relative: 13 %
NEUTROS ABS: 16.8 10*3/uL — AB (ref 1.7–7.7)
NEUTROS PCT: 78 %
PLATELETS: 493 10*3/uL — AB (ref 150–400)
RBC: 2.75 MIL/uL — AB (ref 3.87–5.11)
RDW: 14.7 % (ref 11.5–15.5)
WBC: 21.5 10*3/uL — AB (ref 4.0–10.5)

## 2017-07-02 MED ORDER — METHOCARBAMOL 1000 MG/10ML IJ SOLN
500.0000 mg | Freq: Three times a day (TID) | INTRAMUSCULAR | Status: DC | PRN
  Administered 2017-07-02 – 2017-07-05 (×5): 500 mg via INTRAVENOUS
  Filled 2017-07-02 (×6): qty 5

## 2017-07-02 NOTE — Progress Notes (Signed)
I received a referral from pt's nurse due to pt's ongoing challenges following her surgery.  Pt did not wish to talk at this time, but is aware that we are available for spiritual and emotional support at any time.  Chaplain Katy Coltyn Hanning, B[facility] Pager, [phone] 3:43 PM    07/02/17 1500  Clinical Encounter Type  Visited With Patient and family together  Visit Type Initial  Referral From Nurse  Stress Factors  Patient Stress Factors Exhausted;Health changes

## 2017-07-02 NOTE — Progress Notes (Signed)
Dr Dellis Filbert called asking for General Surgeon on call. Numbers given for [facility] and [facility] general surgeon.

## 2017-07-02 NOTE — Progress Notes (Signed)
Patient ID: Kristine Madden, female   DOB: May 15, 1966, 51 y.o.   MRN: [id number]   At 01:30 this AM I spoke with Dr. Dellis Filbert about [name] and I reviewed her chart and her CT scan.  I believe that the findings support an ileus and not a SBO that requires reoperation.  In fact, reoperation at this time would pose many enterotomy dangers.  If there are forming abscesses in the pelvis that remain undrained and could effect intestinal motility, then these would best be managed with CT guided aspiration and drainage or with possible manipulation of the drain that was left by Drs. LaVoie and Ninfa Linden this past weekend.  [name] is on the [facility] MD service list and will be followed.  Recommend interventional radiology to review CT for possible sites to aspirate/drain.  Matt B. Hassell Done, MD, FACS

## 2017-07-02 NOTE — Progress Notes (Signed)
Dr Holley Dexter called to speak with pt but pt was sleeping and not arousable so MD spoke with pt husband and CT results and General Surgery consult.

## 2017-07-02 NOTE — Progress Notes (Signed)
Pt down for CT scan.

## 2017-07-02 NOTE — Progress Notes (Signed)
Patient husband agitated/anxious about "lack of plan" and pt's slow progress. Patient tearful and agitated about the NG tube smell and her frustration with not being able to eat and not getting better. RN provided emotional support, educational information on ilues, aromatherapy to limit the smell, and reassurance of patient progress made today. Room clean ordered to help with smell. Patient stated appreciation for reassurance and aromatherapy.

## 2017-07-02 NOTE — Progress Notes (Signed)
Patient ID: Kristine Madden, female   DOB: 1966/09/08, 51 y.o.   MRN: [id number]  [facility] Surgery Progress Note  4 Days Post-Op  Subjective: CC- ileus Sitting up in bed, family at bedside. States that her abdominal distension is a little less today. No flatus or BM. Denies n/v. Has been ambulating.  Objective: Vital signs in last 24 hours: Temp:  [98.4 F (36.9 C)-99.8 F (37.7 C)] 99.8 F (37.7 C) (07/11 1349) Pulse Rate:  [91-104] 95 (07/11 1349) Resp:  [14-100] 100 (07/11 1349) BP: (118-130)/(68-74) 126/73 (07/11 1349) SpO2:  [94 %-97 %] 95 % (07/11 1349) Last BM Date: 06/30/17  Intake/Output from previous day: 07/10 0701 - 07/11 0700 In: -  Out: 980 [Emesis/NG output:790; Drains:190] Intake/Output this shift: Total I/O In: -  Out: 560 [Emesis/NG output:550; Drains:10]  PE: Gen:  Alert, NAD, pleasant HEENT: EOM's intact, pupils equal  Card:  RRR, no M/G/R heard Pulm:  effort normal Abd: Soft, mild distension, incisional tenderness, + hyperactive BS, incisions cdi with honeycomb dressings in place, drain with serosanguinous drainage Psych: A&Ox3   Lab Results:   Recent Labs  07/01/17 0904 07/02/17 0514  WBC 24.4* 21.5*  HGB 9.6* 8.7*  HCT 27.7* 24.8*  PLT 473* 493*   BMET No results for input(s): NA, K, CL, CO2, GLUCOSE, BUN, CREATININE, CALCIUM in the last 72 hours. PT/INR No results for input(s): LABPROT, INR in the last 72 hours. CMP     Component Value Date/Time   NA 138 07/04/2016 1218   K 3.6 07/04/2016 1218   CL 103 07/04/2016 1218   CO2 27 07/04/2016 1218   GLUCOSE 104 (H) 07/04/2016 1218   BUN 7 07/04/2016 1218   CREATININE 0.58 07/04/2016 1218   CALCIUM 9.2 07/04/2016 1218   PROT 7.1 09/04/2016 1158   ALBUMIN 4.3 09/04/2016 1158   AST 49 (H) 09/04/2016 1158   ALT 52 (H) 09/04/2016 1158   ALKPHOS 65 09/04/2016 1158   BILITOT 0.5 09/04/2016 1158   GFRNONAA >60 07/04/2016 1218   GFRAA >60 07/04/2016 1218   Lipase  No results  found for: LIPASE     Studies/Results: Ct Abdomen Pelvis W Contrast  Result Date: 07/02/2017 CLINICAL DATA:  Status post hysterectomy now with abdominal distension and elevated white cell count EXAM: CT ABDOMEN AND PELVIS WITH CONTRAST TECHNIQUE: Multidetector CT imaging of the abdomen and pelvis was performed using the standard protocol following bolus administration of intravenous contrast. CONTRAST:  148mL ISOVUE-300 IOPAMIDOL (ISOVUE-300) INJECTION 61% COMPARISON:  06/30/2017, 07/04/2016, 06/01/2009 FINDINGS: Lower chest: Small bilateral pleural effusions, right greater than left with partial consolidation in the bases, likely due to atelectasis. The heart is nonenlarged. Hepatobiliary: No focal liver abnormality is seen. No gallstones, gallbladder wall thickening, or biliary dilatation. Pancreas: History of partial pancreatectomy. No inflammatory changes are present. No ductal dilatation. Spleen: Status post splenectomy Adrenals/Urinary Tract: Adrenal glands are within normal limits. Stable cyst in the mid left kidney. No hydronephrosis. Bladder distended. Stomach/Bowel: Esophageal tube present in the proximal stomach. Multiple loops of fluid-filled dilated small bowel measuring up to 4.6 cm, concerning for small bowel obstruction. Difficult to delineate transition point due to lack of enteral contrast, suspect that the transition point is in the central pelvis were there appear to be thickened distal small bowel loops. Appendix is not identified with confidence. No colon wall thickening. Vascular/Lymphatic: Aortic atherosclerosis. Mild retroperitoneal adenopathy. Reproductive: Status post hysterectomy.  No adnexal masses. Other: Small pockets of soft tissue gas within the subcutaneous  soft tissues of the pelvis, likely due to recent postoperative status. Drainage tube present within the central pelvis. Gas and fluid fluid collections surrounding the drainage tube may reflect residual post- operative  collection or possibly an abscess. This measures approximately 1.7 x 4.8 cm. There is a suspected slightly rim enhancing fluid and gas collection within the upper pelvis measuring 6.4 cm craniocaudad by 3.6 cm transverse by 3.3 cm AP which may reflect an abscess. Mild peritoneal enhancement around fluid adjacent to the liver. Musculoskeletal: Degenerative changes at L5-S1. No acute or suspicious bone lesion IMPRESSION: 1. Multiple fluid-filled loops of dilated small bowel concerning for mechanical small bowel obstruction. Transition zone not well delineated for reasons discussed above, obstruction probably related to thickened decompressed distal small bowel in the mid to low pelvis. 2. Drainage catheter in the central pelvis with associated gas and fluid collection possible post- operative collection versus abscess. Additional mildly rim enhancing gas and fluid collection within the upper pelvis, suspect for abscess. 3. Multiple foci of soft tissue gas within the anterior abdominal wall and pelvis, felt related to recent postsurgical status. 4. Small amount of fluid adjacent to the liver with suspected mild peritoneal enhancement, raising possibility of peritonitis. 5. Small bilateral pleural effusions with adjacent atelectasis Electronically Signed   By: Donavan Foil M.D.   On: 07/02/2017 00:57   Dg Abd 2 Views  Result Date: 06/30/2017 CLINICAL DATA:  Postop ileus surgery 06/27/2017 with nausea EXAM: ABDOMEN - 2 VIEW COMPARISON:  06/01/2009, 07/04/2016 FINDINGS: Lung bases are clear. Esophageal tube tip and side-port project over the proximal to mid stomach. Surgical clips in the upper abdominal region. Presumed drain in the pelvis. Calcified phleboliths in the right pelvis. Multiple loops of dilated small bowel measuring up to 4.4 cm with multiple fluid levels present. Mild gaseous dilatation of the colon with some gas also present in the rectum. Small amount of stool in the left lower quadrant IMPRESSION: 1.  Multiple loops of dilated small bowel up to 4.4 cm with multiple fluid levels. Mild gaseous enlargement of the colon with some gas present in the rectum. Findings could be secondary to ileus although partial or developing bowel obstruction is also in the differential given radiographic appearance. Electronically Signed   By: Donavan Foil M.D.   On: 06/30/2017 18:59    Anti-infectives: Anti-infectives    Start     Dose/Rate Route Frequency Ordered Stop   06/30/17 2000  clindamycin (CLEOCIN) IVPB 900 mg     900 mg 100 mL/hr over 30 Minutes Intravenous Every 8 hours 06/30/17 1712     06/30/17 1800  gentamicin (GARAMYCIN) 150 mg in dextrose 5 % 50 mL IVPB     2.5 mg/kg  60.2 kg 107.5 mL/hr over 30 Minutes Intravenous Every 12 hours 06/30/17 1712     06/29/17 1400  clindamycin (CLEOCIN) capsule 300 mg  Status:  Discontinued     300 mg Oral Every 8 hours 06/29/17 1118 06/30/17 1712   06/29/17 1130  ciprofloxacin (CIPRO) tablet 250 mg  Status:  Discontinued     250 mg Oral 2 times daily 06/29/17 1118 06/30/17 1712   06/29/17 0200  gentamicin (GARAMYCIN) 300 mg in dextrose 5 % 50 mL IVPB     5 mg/kg  60.2 kg 115 mL/hr over 30 Minutes Intravenous  Once 06/28/17 0507 06/29/17 0230   06/28/17 1000  clindamycin (CLEOCIN) IVPB 900 mg     900 mg 100 mL/hr over 30 Minutes Intravenous Every 8 hours 06/28/17  0507 06/29/17 0641   06/28/17 0030  gentamicin (GARAMYCIN) 300 mg, clindamycin (CLEOCIN) 900 mg in dextrose 5 % 100 mL IVPB     227 mL/hr over 30 Minutes Intravenous  Once 06/28/17 0029 06/28/17 0039   06/28/17 0015  clindamycin (CLEOCIN) IVPB 900 mg  Status:  Discontinued     900 mg 100 mL/hr over 30 Minutes Intravenous  Once 06/28/17 0005 06/28/17 0029   06/28/17 0015  gentamicin (GARAMYCIN) IVPB 100 mg  Status:  Discontinued     100 mg 200 mL/hr over 30 Minutes Intravenous  Once 06/28/17 0005 06/28/17 0052   06/27/17 2200  metroNIDAZOLE (FLAGYL) tablet 500 mg  Status:  Discontinued     500 mg  Oral Every 12 hours 06/27/17 1649 06/28/17 0507   06/27/17 1330  metroNIDAZOLE (FLAGYL) IVPB 500 mg     500 mg 100 mL/hr over 60 Minutes Intravenous  Once 06/27/17 1317 06/27/17 1330       Assessment/Plan H/o ROBOTIC ASSISTED TOTAL LAPAROSCOPIC HYSTERECTOMY, Bilateral Salpingectomy,  UTEROSACRAL LIGAMENT SUSPENSION 6/26 Dr. Dellis Filbert  Postop bleeding S/p REPAIR VAGINAL CUFF 7/6 Dr. Dellis Filbert  S/p VAGINAL EXAM UNDER ANESTHESIA,LAPAROSCOPY DIAGNOSTIC, EXPLORATORY LAPAROTOMY WITH EVACUATION OF HEMATOMA 06/28/17 Dr. Ninfa Linden and Dr. Dellis Filbert - POD 4 - WBC trending down 21.5 from 24.4; afebrile - drain with 190cc/24hr serosanguinous - Hg 8.7, monitor - CT scan 7/10 showed ileus vs SBO (more likely ileus), pelvic fluid collections concerning for abscess - IR consulted and did not recommend drainage of fluid collections due to small sizes, hopefully JP drain already in place will help to drain them - on IV gentamycin/clindamycin - no BM or flatus, but patient does feel a little less distended today. Agree with continuing NPO/NGT. Continue to encourage ambulation. Continue IV abx. Continue trying to limit IV narcotics; add IV robaxin PRN; check BMP tomorrow and could consider starting IV toradol for better pain control.  ID - gentamycin 7/7>>, clindamycin 7/7>>, flagyl 7/6>>7/7 FEN - IVF, NPO/NGT VTE - SCDs   LOS: 4 days    Jerrye Beavers , [facility] Surgery 07/02/2017, 2:30 PM Pager: [phone] Consults: [phone] Mon-Fri 7:00 am-4:30 pm Sat-Sun 7:00 am-11:30 am

## 2017-07-02 NOTE — Progress Notes (Signed)
POD#5 Pop Ileus NG tube.  Genta/Clinda IV.  Called patient last night to inquire about her state and passage of gas.  Given very little passage of gas, an Abdo-pelvic CT with IV contrast was requested.  CT scan showed small bowel distention to 4.6 cm, but no point of obstruction and fluid collections.  I called Dr Johnathan Hausen general surgery for his opinion (refer to his note).  His impression was of an Ileus rather than SBO, so no reintervention recommended.  He recommended consulting Intervention Radiology to assess indication to drain fluid collections/possible abscesses.  Intervention Radiology requested in consult this morning.  After reviewing the CT findings, Dr Boone Master recommends no attempt to drain under CT guidance, because the pelvic fluid collection is too small and will probably be drain without intervention by the Jackson-Pratt drain in current position.  He feels that the other mid abdominal collection may actually be distended bowel loops.   Subjective: Patient reports:  Very little, but positive passage of gas yesterday.  Didn't get up yet today.  Feels less distended, less abdominal/pelvic pain.  No chill.  No vaginal bleeding.  Normal mictions.   Objective: I have reviewed patient's vital signs.  vital signs, intake and output, medications and labs.  Vitals:   07/02/17 0005 07/02/17 0406  BP: 130/74 127/68  Pulse: (!) 104 94  Resp: 18 18  Temp: 98.4 F (36.9 C) 98.5 F (36.9 C)   I/O last 3 completed shifts: In: -  Out: 2110 [Emesis/NG output:1040; Drains:1070] No intake/output data recorded.  Results for orders placed or performed during the hospital encounter of 06/27/17 (from the past 24 hour(s))  CBC with Differential/Platelet     Status: Abnormal   Collection Time: 07/01/17  9:04 AM  Result Value Ref Range   WBC 24.4 (H) 4.0 - 10.5 K/uL   RBC 3.09 (L) 3.87 - 5.11 MIL/uL   Hemoglobin 9.6 (L) 12.0 - 15.0 g/dL   HCT 27.7 (L) 36.0 - 46.0 %   MCV 89.6 78.0 -  100.0 fL   MCH 31.1 26.0 - 34.0 pg   MCHC 34.7 30.0 - 36.0 g/dL   RDW 14.5 11.5 - 15.5 %   Platelets 473 (H) 150 - 400 K/uL   Neutrophils Relative % 85 %   Neutro Abs 20.9 (H) 1.7 - 7.7 K/uL   Lymphocytes Relative 5 %   Lymphs Abs 1.2 0.7 - 4.0 K/uL   Monocytes Relative 9 %   Monocytes Absolute 2.1 (H) 0.1 - 1.0 K/uL   Eosinophils Relative 1 %   Eosinophils Absolute 0.3 0.0 - 0.7 K/uL   Basophils Relative 0 %   Basophils Absolute 0.0 0.0 - 0.1 K/uL  CBC with Differential/Platelet     Status: Abnormal   Collection Time: 07/02/17  5:14 AM  Result Value Ref Range   WBC 21.5 (H) 4.0 - 10.5 K/uL   RBC 2.75 (L) 3.87 - 5.11 MIL/uL   Hemoglobin 8.7 (L) 12.0 - 15.0 g/dL   HCT 24.8 (L) 36.0 - 46.0 %   MCV 90.2 78.0 - 100.0 fL   MCH 31.6 26.0 - 34.0 pg   MCHC 35.1 30.0 - 36.0 g/dL   RDW 14.7 11.5 - 15.5 %   Platelets 493 (H) 150 - 400 K/uL   Neutrophils Relative % 78 %   Neutro Abs 16.8 (H) 1.7 - 7.7 K/uL   Lymphocytes Relative 6 %   Lymphs Abs 1.4 0.7 - 4.0 K/uL   Monocytes Relative 13 %  Monocytes Absolute 2.7 (H) 0.1 - 1.0 K/uL   Eosinophils Relative 3 %   Eosinophils Absolute 0.6 0.0 - 0.7 K/uL   Basophils Relative 0 %   Basophils Absolute 0.0 0.0 - 0.1 K/uL    EXAM General: alert and cooperative Resp: Good A/E, but mild wheezes at both bases Cardio: regular rate and rhythm GI:  Decreased distention, depressible.  BS present throughout, increased at Left upper quadrant.  Incisions intact. Extremities: no edema, redness or tenderness in the calves or thighs Vaginal Bleeding: none  Assessment: s/p Procedure(s): VAGINAL EXAM UNDER ANESTHESIA,LAPAROSCOPY DIAGNOSTIC, EXPLORATORY LAPAROTOMY WITH EVACUATION OF HEMATOMA: ileus present.  No current evidence of SBO per CT findings reviewed by Dr Hassell Done.  Responding to ABTx IV.  No indication to attempt drainage of small fluid collection under CT guidance per Dr Boone Master.  Mild Atelectasis at lung bases.  Plan: NPO.  Continue same  management with NG tube, pelvic drain and IV ABTx.  Recommend to increase Spirometry.  LOS: 4 days    Kristine Bruins, MD 07/02/2017 7:55 AM    07/02/2017, 7:55 AM

## 2017-07-03 ENCOUNTER — Encounter (HOSPITAL_COMMUNITY): Payer: Self-pay | Admitting: *Deleted

## 2017-07-03 DIAGNOSIS — T814XXS Infection following a procedure, sequela: Secondary | ICD-10-CM

## 2017-07-03 DIAGNOSIS — K9189 Other postprocedural complications and disorders of digestive system: Secondary | ICD-10-CM

## 2017-07-03 LAB — BASIC METABOLIC PANEL
ANION GAP: 8 (ref 5–15)
BUN: 5 mg/dL — ABNORMAL LOW (ref 6–20)
CALCIUM: 7.5 mg/dL — AB (ref 8.9–10.3)
CO2: 31 mmol/L (ref 22–32)
Chloride: 94 mmol/L — ABNORMAL LOW (ref 101–111)
Creatinine, Ser: 0.47 mg/dL (ref 0.44–1.00)
GFR calc Af Amer: >60 mL/min (ref >60)
GFR calc non Af Amer: >60 mL/min (ref >60)
GLUCOSE: 130 mg/dL — AB (ref 65–99)
Potassium: 2.6 mmol/L — CL (ref 3.5–5.1)
Sodium: 133 mmol/L — ABNORMAL LOW (ref 135–145)

## 2017-07-03 LAB — CBC
HEMATOCRIT: 25.7 % — AB (ref 36.0–46.0)
HEMOGLOBIN: 9 g/dL — AB (ref 12.0–15.0)
MCH: 31 pg (ref 26.0–34.0)
MCHC: 35 g/dL (ref 30.0–36.0)
MCV: 88.6 fL (ref 78.0–100.0)
Platelets: 554 10*3/uL — ABNORMAL HIGH (ref 150–400)
RBC: 2.9 MIL/uL — ABNORMAL LOW (ref 3.87–5.11)
RDW: 14.4 % (ref 11.5–15.5)
WBC: 23.1 10*3/uL — ABNORMAL HIGH (ref 4.0–10.5)

## 2017-07-03 MED ORDER — VANCOMYCIN HCL IN DEXTROSE 750-5 MG/150ML-% IV SOLN
750.0000 mg | Freq: Two times a day (BID) | INTRAVENOUS | Status: DC
  Administered 2017-07-03 – 2017-07-04 (×4): 750 mg via INTRAVENOUS
  Filled 2017-07-03 (×5): qty 150

## 2017-07-03 MED ORDER — POTASSIUM CHLORIDE 2 MEQ/ML IV SOLN
Freq: Once | INTRAVENOUS | Status: AC
  Administered 2017-07-03: 10:00:00 via INTRAVENOUS
  Filled 2017-07-03: qty 1000

## 2017-07-03 MED ORDER — POTASSIUM CHLORIDE 2 MEQ/ML IV SOLN
INTRAVENOUS | Status: DC
  Administered 2017-07-03 – 2017-07-05 (×5): via INTRAVENOUS
  Filled 2017-07-03 (×6): qty 1000

## 2017-07-03 MED ORDER — PIPERACILLIN-TAZOBACTAM 3.375 G IVPB
3.3750 g | Freq: Three times a day (TID) | INTRAVENOUS | Status: DC
  Administered 2017-07-03 – 2017-07-06 (×9): 3.375 g via INTRAVENOUS
  Filled 2017-07-03 (×11): qty 50

## 2017-07-03 NOTE — Progress Notes (Signed)
Pharmacy Antibiotic Note  Kristine Madden is a 51 y.o. female admitted on 06/27/2017 s/p 10 days TLH with c/o vaginal bleeding, discharge and mild RLQ pain. Now, 6 days Post-Op CC-ileus pt presents with temp 100.4 & increased WBC=23.1 from 21.5 yesterday Pharmacy has been consulted for Vancomycin dosing.  Plan: 1)Vancomycin 750mg  IV Q12 hours 2) Will follow & adjust levels as needed.  Therapeutic trough goal ~ 10-15  Height: 5\' 7"  (170.2 cm) Weight: 132 lb 11.2 oz (60.2 kg) IBW/kg (Calculated) : 61.6  Temp (24hrs), Avg:99.3 F (37.4 C), Min:98.6 F (37 C), Max:100.4 F (38 C)   Recent Labs Lab 06/28/17 1136 06/30/17 0608 07/01/17 0904 07/02/17 0514 07/03/17 0459  WBC 36.0* 31.1* 24.4* 21.5* 23.1*  CREATININE  --   --   --   --  0.47    Estimated Creatinine Clearance: 78.2 mL/min (by C-G formula based on SCr of 0.47 mg/dL).    Allergies  Allergen Reactions  . Hydrocodone Nausea And Vomiting  . Penicillins Hives    Has patient had a PCN reaction causing immediate rash, facial/tongue/throat swelling, SOB or lightheadedness with hypotension: No Has patient had a PCN reaction causing severe rash involving mucus membranes or skin necrosis: No Has patient had a PCN reaction that required hospitalization No Has patient had a PCN reaction occurring within the last 10 years: No If all of the above answers are "NO", then may proceed with Cephalosporin use.   Marland Kitchen Percocet [Oxycodone-Acetaminophen] Itching and Nausea And Vomiting    Nausea and vomitting  . Other Rash    Mango    Antimicrobials this admission: Gent/Clinda>> 7/7-7/12/18  Dose adjustments this admission: n/a  Thank you for allowing pharmacy to be a part of this patient's care.  Hovey-Rankin, Earline Stiner 07/03/2017 9:35 AM

## 2017-07-03 NOTE — Progress Notes (Signed)
POD#6  Pop Ileus NG tube.  Abdo-pelvic infection on Genta/Clinda. Subjective: Patient reports:  Having + flatus and + BM x 2 since last night!!!  Ambulating.  Mictions normal.    Objective: I have reviewed patient's vital signs.  vital signs, intake and output, medications and labs.  Vitals:   07/03/17 0358 07/03/17 0436  BP: 138/80   Pulse: 98   Resp: 17   Temp: (!) 100.4 F (38 C) 98.6 F (37 C)   I/O last 3 completed shifts: In: 300 [NG/GT:300] Out: 2910 [Urine:500; Emesis/NG output:2200; Drains:210] No intake/output data recorded.  Results for orders placed or performed during the hospital encounter of 06/27/17 (from the past 24 hour(s))  Basic metabolic panel     Status: Abnormal   Collection Time: 07/03/17  4:59 AM  Result Value Ref Range   Sodium 133 (L) 135 - 145 mmol/L   Potassium 2.6 (LL) 3.5 - 5.1 mmol/L   Chloride 94 (L) 101 - 111 mmol/L   CO2 31 22 - 32 mmol/L   Glucose, Bld 130 (H) 65 - 99 mg/dL   BUN 5 (L) 6 - 20 mg/dL   Creatinine, Ser 0.47 0.44 - 1.00 mg/dL   Calcium 7.5 (L) 8.9 - 10.3 mg/dL   GFR calc non Af Amer >60 >60 mL/min   GFR calc Af Amer >60 >60 mL/min   Anion gap 8 5 - 15  CBC     Status: Abnormal   Collection Time: 07/03/17  4:59 AM  Result Value Ref Range   WBC 23.1 (H) 4.0 - 10.5 K/uL   RBC 2.90 (L) 3.87 - 5.11 MIL/uL   Hemoglobin 9.0 (L) 12.0 - 15.0 g/dL   HCT 25.7 (L) 36.0 - 46.0 %   MCV 88.6 78.0 - 100.0 fL   MCH 31.0 26.0 - 34.0 pg   MCHC 35.0 30.0 - 36.0 g/dL   RDW 14.4 11.5 - 15.5 %   Platelets 554 (H) 150 - 400 K/uL    EXAM General: alert and cooperative Resp: clear to auscultation bilaterally Cardio: regular rate and rhythm GI:  Soft, much decreased distention.  NT.  No mass felt.  Incisions intact.  BS normal throughout, no high pitch or increased BS. Extremities: no edema, redness or tenderness in the calves or thighs Vaginal Bleeding: none  Assessment: s/p Procedure(s): VAGINAL EXAM UNDER ANESTHESIA,LAPAROSCOPY  DIAGNOSTIC, EXPLORATORY LAPAROTOMY WITH EVACUATION OF HEMATOMA: stable and progressing well.    1) Resolving Ileus.    2) Abdo-pelvic infection:  Subfebrile with WBC count high and mildly increased from 21.5 to 23.1 today.    3)  Hypokalemia K+ 2.6.  Plan:  1) NG tube removed.  Clear liquid diet today.  Ambulation.  MultiVits in IV x 1.  2)  Change ABTx IV to Zosyn/Vanco.  Repeat CBC with diff tomorrow am.  Following amount and quality of drainage in JP drain.  Will keep in place at this time, will appreciate recommendations from General Surgery.  3)  KCl added to IV fluid.  Repeat CMP tomorrow am.   LOS: 5 days    [name], MD 07/03/2017 8:18 AM    07/03/2017, 8:18 AM

## 2017-07-03 NOTE — Progress Notes (Signed)
Patient ID: Kristine Madden, female   DOB: 1966/03/12, 51 y.o.   MRN: [id number]  [facility] Surgery Progress Note  5 Days Post-Op  Subjective: CC- ileus Patient has been passing flatus and had 2 small BM's, NG tube was removed and she was started on clears. Feeling much better from abdominal standpoint. Robaxin helps. States that fluid in JP drain was initially serosanguinous, now purulent and has foul odor. WBC went up to 23.1 today. Antibiotics were switched to zosyn/vancomycin.  Objective: Vital signs in last 24 hours: Temp:  [98.6 F (37 C)-100.4 F (38 C)] 98.6 F (37 C) (07/12 0436) Pulse Rate:  [86-106] 86 (07/12 0800) Resp:  [17-100] 17 (07/12 0358) BP: (119-138)/(73-88) 135/77 (07/12 0800) SpO2:  [93 %-99 %] 99 % (07/12 0800) Last BM Date: 06/30/17  Intake/Output from previous day: 07/11 0701 - 07/12 0700 In: 300 [NG/GT:300] Out: 2440 [Urine:500; Emesis/NG output:1900; Drains:40] Intake/Output this shift: No intake/output data recorded.  PE: Gen:  Alert, NAD, pleasant HEENT: EOM's intact, pupils equal  Card:  RRR, no M/G/R heard Pulm:  effort normal Abd: Soft, mild distension, nontender, + hyperactive BS, incisions cdi with honeycomb dressings in place, drain with feculent/purulent drainage Psych: A&Ox3   Lab Results:   Recent Labs  07/02/17 0514 07/03/17 0459  WBC 21.5* 23.1*  HGB 8.7* 9.0*  HCT 24.8* 25.7*  PLT 493* 554*   BMET  Recent Labs  07/03/17 0459  NA 133*  K 2.6*  CL 94*  CO2 31  GLUCOSE 130*  BUN 5*  CREATININE 0.47  CALCIUM 7.5*   PT/INR No results for input(s): LABPROT, INR in the last 72 hours. CMP     Component Value Date/Time   NA 133 (L) 07/03/2017 0459   K 2.6 (LL) 07/03/2017 0459   CL 94 (L) 07/03/2017 0459   CO2 31 07/03/2017 0459   GLUCOSE 130 (H) 07/03/2017 0459   BUN 5 (L) 07/03/2017 0459   CREATININE 0.47 07/03/2017 0459   CALCIUM 7.5 (L) 07/03/2017 0459   PROT 7.1 09/04/2016 1158   ALBUMIN 4.3  09/04/2016 1158   AST 49 (H) 09/04/2016 1158   ALT 52 (H) 09/04/2016 1158   ALKPHOS 65 09/04/2016 1158   BILITOT 0.5 09/04/2016 1158   GFRNONAA >60 07/03/2017 0459   GFRAA >60 07/03/2017 0459   Lipase  No results found for: LIPASE     Studies/Results: Ct Abdomen Pelvis W Contrast  Result Date: 07/02/2017 CLINICAL DATA:  Status post hysterectomy now with abdominal distension and elevated white cell count EXAM: CT ABDOMEN AND PELVIS WITH CONTRAST TECHNIQUE: Multidetector CT imaging of the abdomen and pelvis was performed using the standard protocol following bolus administration of intravenous contrast. CONTRAST:  139mL ISOVUE-300 IOPAMIDOL (ISOVUE-300) INJECTION 61% COMPARISON:  06/30/2017, 07/04/2016, 06/01/2009 FINDINGS: Lower chest: Small bilateral pleural effusions, right greater than left with partial consolidation in the bases, likely due to atelectasis. The heart is nonenlarged. Hepatobiliary: No focal liver abnormality is seen. No gallstones, gallbladder wall thickening, or biliary dilatation. Pancreas: History of partial pancreatectomy. No inflammatory changes are present. No ductal dilatation. Spleen: Status post splenectomy Adrenals/Urinary Tract: Adrenal glands are within normal limits. Stable cyst in the mid left kidney. No hydronephrosis. Bladder distended. Stomach/Bowel: Esophageal tube present in the proximal stomach. Multiple loops of fluid-filled dilated small bowel measuring up to 4.6 cm, concerning for small bowel obstruction. Difficult to delineate transition point due to lack of enteral contrast, suspect that the transition point is in the central pelvis were there  appear to be thickened distal small bowel loops. Appendix is not identified with confidence. No colon wall thickening. Vascular/Lymphatic: Aortic atherosclerosis. Mild retroperitoneal adenopathy. Reproductive: Status post hysterectomy.  No adnexal masses. Other: Small pockets of soft tissue gas within the subcutaneous  soft tissues of the pelvis, likely due to recent postoperative status. Drainage tube present within the central pelvis. Gas and fluid fluid collections surrounding the drainage tube may reflect residual post- operative collection or possibly an abscess. This measures approximately 1.7 x 4.8 cm. There is a suspected slightly rim enhancing fluid and gas collection within the upper pelvis measuring 6.4 cm craniocaudad by 3.6 cm transverse by 3.3 cm AP which may reflect an abscess. Mild peritoneal enhancement around fluid adjacent to the liver. Musculoskeletal: Degenerative changes at L5-S1. No acute or suspicious bone lesion IMPRESSION: 1. Multiple fluid-filled loops of dilated small bowel concerning for mechanical small bowel obstruction. Transition zone not well delineated for reasons discussed above, obstruction probably related to thickened decompressed distal small bowel in the mid to low pelvis. 2. Drainage catheter in the central pelvis with associated gas and fluid collection possible post- operative collection versus abscess. Additional mildly rim enhancing gas and fluid collection within the upper pelvis, suspect for abscess. 3. Multiple foci of soft tissue gas within the anterior abdominal wall and pelvis, felt related to recent postsurgical status. 4. Small amount of fluid adjacent to the liver with suspected mild peritoneal enhancement, raising possibility of peritonitis. 5. Small bilateral pleural effusions with adjacent atelectasis Electronically Signed   By: Donavan Foil M.D.   On: 07/02/2017 00:57    Anti-infectives: Anti-infectives    Start     Dose/Rate Route Frequency Ordered Stop   07/03/17 1000  vancomycin (VANCOCIN) IVPB 750 mg/150 ml premix     750 mg 150 mL/hr over 60 Minutes Intravenous Every 12 hours 07/03/17 0921     07/03/17 0900  piperacillin-tazobactam (ZOSYN) IVPB 3.375 g     3.375 g 12.5 mL/hr over 240 Minutes Intravenous Every 8 hours 07/03/17 0847     06/30/17 2000   clindamycin (CLEOCIN) IVPB 900 mg  Status:  Discontinued     900 mg 100 mL/hr over 30 Minutes Intravenous Every 8 hours 06/30/17 1712 07/03/17 0847   06/30/17 1800  gentamicin (GARAMYCIN) 150 mg in dextrose 5 % 50 mL IVPB  Status:  Discontinued     2.5 mg/kg  60.2 kg 107.5 mL/hr over 30 Minutes Intravenous Every 12 hours 06/30/17 1712 07/03/17 0847   06/29/17 1400  clindamycin (CLEOCIN) capsule 300 mg  Status:  Discontinued     300 mg Oral Every 8 hours 06/29/17 1118 06/30/17 1712   06/29/17 1130  ciprofloxacin (CIPRO) tablet 250 mg  Status:  Discontinued     250 mg Oral 2 times daily 06/29/17 1118 06/30/17 1712   06/29/17 0200  gentamicin (GARAMYCIN) 300 mg in dextrose 5 % 50 mL IVPB     5 mg/kg  60.2 kg 115 mL/hr over 30 Minutes Intravenous  Once 06/28/17 0507 06/29/17 0230   06/28/17 1000  clindamycin (CLEOCIN) IVPB 900 mg     900 mg 100 mL/hr over 30 Minutes Intravenous Every 8 hours 06/28/17 0507 06/29/17 0641   06/28/17 0030  gentamicin (GARAMYCIN) 300 mg, clindamycin (CLEOCIN) 900 mg in dextrose 5 % 100 mL IVPB     227 mL/hr over 30 Minutes Intravenous  Once 06/28/17 0029 06/28/17 0039   06/28/17 0015  clindamycin (CLEOCIN) IVPB 900 mg  Status:  Discontinued  900 mg 100 mL/hr over 30 Minutes Intravenous  Once 06/28/17 0005 06/28/17 0029   06/28/17 0015  gentamicin (GARAMYCIN) IVPB 100 mg  Status:  Discontinued     100 mg 200 mL/hr over 30 Minutes Intravenous  Once 06/28/17 0005 06/28/17 0052   06/27/17 2200  metroNIDAZOLE (FLAGYL) tablet 500 mg  Status:  Discontinued     500 mg Oral Every 12 hours 06/27/17 1649 06/28/17 0507   06/27/17 1330  metroNIDAZOLE (FLAGYL) IVPB 500 mg     500 mg 100 mL/hr over 60 Minutes Intravenous  Once 06/27/17 1317 06/27/17 1330       Assessment/Plan H/o ROBOTIC ASSISTED TOTAL LAPAROSCOPICHYSTERECTOMY, Bilateral Salpingectomy, UTEROSACRAL LIGAMENT SUSPENSION 6/26 Dr. Dellis Filbert  Postop bleeding S/p REPAIR VAGINAL CUFF 7/6 Dr. Dellis Filbert  S/p  VAGINAL EXAM UNDER ANESTHESIA,LAPAROSCOPY DIAGNOSTIC, EXPLORATORY LAPAROTOMY WITH EVACUATION OF HEMATOMA 06/28/17 Dr. Ninfa Linden and Dr. Dellis Filbert - POD 5 - WBC up to 23.1 from 21.5; afebrile - drain with 10cc/24hr feculent/purulent - CT scan 7/10 showed ileus vs SBO (more likely ileus), pelvic fluid collections concerning for abscess - IR consulted and did not recommend drainage of fluid collections due to small sizes, hopefully JP drain already in place will help to drain them - +BM and flatus. NG is out and she is tolerating clear liquids  Anemia - Hg 9, monitor Hypokalemia - K 2.6, being replaced  ID - zosyn 7/12>>, vancomycin 7/12>>, gentamycin 7/7>>7/12, clindamycin 7/7>>7/12, flagyl 7/6>>7/7 FEN - IVF, clear liquids VTE - SCDs  Plan - agree with advancing to clear liquids. Continue ambulating. Recommend trying to keep potassium >4. Pain control seems to have improved. Continue JP drain and monitor output. Labs in AM.   LOS: 5 days    Jerrye Beavers , [facility] Surgery 07/03/2017, 10:35 AM Pager: [phone] Consults: [phone] Mon-Fri 7:00 am-4:30 pm Sat-Sun 7:00 am-11:30 am

## 2017-07-04 DIAGNOSIS — D72829 Elevated white blood cell count, unspecified: Secondary | ICD-10-CM

## 2017-07-04 DIAGNOSIS — Z880 Allergy status to penicillin: Secondary | ICD-10-CM

## 2017-07-04 DIAGNOSIS — N8501 Benign endometrial hyperplasia: Secondary | ICD-10-CM

## 2017-07-04 DIAGNOSIS — Z8341 Family history of multiple endocrine neoplasia [MEN] syndrome: Secondary | ICD-10-CM

## 2017-07-04 DIAGNOSIS — F17210 Nicotine dependence, cigarettes, uncomplicated: Secondary | ICD-10-CM

## 2017-07-04 DIAGNOSIS — Z8249 Family history of ischemic heart disease and other diseases of the circulatory system: Secondary | ICD-10-CM

## 2017-07-04 DIAGNOSIS — Z888 Allergy status to other drugs, medicaments and biological substances status: Secondary | ICD-10-CM

## 2017-07-04 DIAGNOSIS — K9189 Other postprocedural complications and disorders of digestive system: Secondary | ICD-10-CM

## 2017-07-04 DIAGNOSIS — Z8261 Family history of arthritis: Secondary | ICD-10-CM

## 2017-07-04 DIAGNOSIS — Z9889 Other specified postprocedural states: Secondary | ICD-10-CM

## 2017-07-04 DIAGNOSIS — T814XXS Infection following a procedure, sequela: Secondary | ICD-10-CM

## 2017-07-04 LAB — CBC WITH DIFFERENTIAL/PLATELET
BASOS ABS: 0 10*3/uL (ref 0.0–0.1)
BASOS PCT: 0 %
EOS ABS: 1.2 10*3/uL — AB (ref 0.0–0.7)
EOS PCT: 5 %
HCT: 28 % — ABNORMAL LOW (ref 36.0–46.0)
Hemoglobin: 9.7 g/dL — ABNORMAL LOW (ref 12.0–15.0)
LYMPHS PCT: 13 %
Lymphs Abs: 3.2 10*3/uL (ref 0.7–4.0)
MCH: 30.8 pg (ref 26.0–34.0)
MCHC: 34.6 g/dL (ref 30.0–36.0)
MCV: 88.9 fL (ref 78.0–100.0)
MONO ABS: 1.6 10*3/uL — AB (ref 0.1–1.0)
Monocytes Relative: 7 %
Neutro Abs: 17.8 10*3/uL — ABNORMAL HIGH (ref 1.7–7.7)
Neutrophils Relative %: 75 %
PLATELETS: 611 10*3/uL — AB (ref 150–400)
RBC: 3.15 MIL/uL — AB (ref 3.87–5.11)
RDW: 14.5 % (ref 11.5–15.5)
WBC: 23.8 10*3/uL — AB (ref 4.0–10.5)

## 2017-07-04 LAB — COMPREHENSIVE METABOLIC PANEL
ALBUMIN: 2.5 g/dL — AB (ref 3.5–5.0)
ALK PHOS: 57 U/L (ref 38–126)
ALT: 14 U/L (ref 14–54)
AST: 21 U/L (ref 15–41)
Anion gap: 9 (ref 5–15)
BILIRUBIN TOTAL: 0.6 mg/dL (ref 0.3–1.2)
CALCIUM: 7.6 mg/dL — AB (ref 8.9–10.3)
CO2: 27 mmol/L (ref 22–32)
CREATININE: 0.47 mg/dL (ref 0.44–1.00)
Chloride: 97 mmol/L — ABNORMAL LOW (ref 101–111)
GFR calc Af Amer: >60 mL/min (ref >60)
GLUCOSE: 121 mg/dL — AB (ref 65–99)
Potassium: 3 mmol/L — ABNORMAL LOW (ref 3.5–5.1)
Sodium: 133 mmol/L — ABNORMAL LOW (ref 135–145)
TOTAL PROTEIN: 5.9 g/dL — AB (ref 6.5–8.1)

## 2017-07-04 MED ORDER — POTASSIUM CHLORIDE CRYS ER 20 MEQ PO TBCR
20.0000 meq | EXTENDED_RELEASE_TABLET | Freq: Two times a day (BID) | ORAL | Status: DC
  Administered 2017-07-04: 20 meq via ORAL
  Filled 2017-07-04 (×2): qty 1

## 2017-07-04 NOTE — Progress Notes (Signed)
Patient ID: Kristine Madden, female   DOB: [birth year], 51 y.o.   MRN: [id number]  [facility] Surgery Progress Note  6 Days Post-Op  Subjective: CC- ileus Kristine Madden was advanced to full liquids this morning. States that when she eats she immediately has a loose BM. Denies any n/v. States that she still feels a little bloated. Passing flatus.   Objective: Vital signs in last 24 hours: Temp:  [98.4 F (36.9 C)-99 F (37.2 C)] 98.4 F (36.9 C) (07/13 0851) Pulse Rate:  [77-95] 77 (07/13 0851) Resp:  [16-18] 16 (07/13 0851) BP: (127-137)/(68-87) 133/68 (07/13 0851) SpO2:  [95 %-98 %] 97 % (07/13 0851) Last BM Date: 07/03/17 (liquid stool with flatus)  Intake/Output from previous day: 07/12 0701 - 07/13 0700 In: 4661.6 [P.O.:1700; I.V.:2495.2; IV Piggyback:466.4] Out: 6818 [Urine:6800; Drains:17; Stool:1] Intake/Output this shift: No intake/output data recorded.  PE: Gen: Alert, NAD, pleasant HEENT: EOM's intact, pupils equal  Card: RRR, no M/G/R heard Pulm: effort normal Abd: Soft, mild distension, nontender, + hyperactive BS, incisions cdi with honeycomb dressings in place, drain with purulent drainage Psych: A&Ox3   Lab Results:   Recent Labs  07/03/17 0459 07/04/17 0502  WBC 23.1* 23.8*  HGB 9.0* 9.7*  HCT 25.7* 28.0*  PLT 554* 611*   BMET  Recent Labs  07/03/17 0459 07/04/17 0502  NA 133* 133*  K 2.6* 3.0*  CL 94* 97*  CO2 31 27  GLUCOSE 130* 121*  BUN 5* <5*  CREATININE 0.47 0.47  CALCIUM 7.5* 7.6*   PT/INR No results for input(s): LABPROT, INR in the last 72 hours. CMP     Component Value Date/Time   NA 133 (L) 07/04/2017 0502   K 3.0 (L) 07/04/2017 0502   CL 97 (L) 07/04/2017 0502   CO2 27 07/04/2017 0502   GLUCOSE 121 (H) 07/04/2017 0502   BUN <5 (L) 07/04/2017 0502   CREATININE 0.47 07/04/2017 0502   CALCIUM 7.6 (L) 07/04/2017 0502   PROT 5.9 (L) 07/04/2017 0502   ALBUMIN 2.5 (L) 07/04/2017 0502   AST 21 07/04/2017 0502   ALT 14 07/04/2017 0502   ALKPHOS 57 07/04/2017 0502   BILITOT 0.6 07/04/2017 0502   GFRNONAA >60 07/04/2017 0502   GFRAA >60 07/04/2017 0502   Lipase  No results found for: LIPASE     Studies/Results: No results found.  Anti-infectives: Anti-infectives    Start     Dose/Rate Route Frequency Ordered Stop   07/03/17 1000  vancomycin (VANCOCIN) IVPB 750 mg/150 ml premix     750 mg 150 mL/hr over 60 Minutes Intravenous Every 12 hours 07/03/17 0921     07/03/17 0900  piperacillin-tazobactam (ZOSYN) IVPB 3.375 g     3.375 g 12.5 mL/hr over 240 Minutes Intravenous Every 8 hours 07/03/17 0847     06/30/17 2000  clindamycin (CLEOCIN) IVPB 900 mg  Status:  Discontinued     900 mg 100 mL/hr over 30 Minutes Intravenous Every 8 hours 06/30/17 1712 07/03/17 0847   06/30/17 1800  gentamicin (GARAMYCIN) 150 mg in dextrose 5 % 50 mL IVPB  Status:  Discontinued     2.5 mg/kg  60.2 kg 107.5 mL/hr over 30 Minutes Intravenous Every 12 hours 06/30/17 1712 07/03/17 0847   06/29/17 1400  clindamycin (CLEOCIN) capsule 300 mg  Status:  Discontinued     300 mg Oral Every 8 hours 06/29/17 1118 06/30/17 1712   06/29/17 1130  ciprofloxacin (CIPRO) tablet 250 mg  Status:  Discontinued  250 mg Oral 2 times daily 06/29/17 1118 06/30/17 1712   06/29/17 0200  gentamicin (GARAMYCIN) 300 mg in dextrose 5 % 50 mL IVPB     5 mg/kg  60.2 kg 115 mL/hr over 30 Minutes Intravenous  Once 06/28/17 0507 06/29/17 0230   06/28/17 1000  clindamycin (CLEOCIN) IVPB 900 mg     900 mg 100 mL/hr over 30 Minutes Intravenous Every 8 hours 06/28/17 0507 06/29/17 0641   06/28/17 0030  gentamicin (GARAMYCIN) 300 mg, clindamycin (CLEOCIN) 900 mg in dextrose 5 % 100 mL IVPB     227 mL/hr over 30 Minutes Intravenous  Once 06/28/17 0029 06/28/17 0039   06/28/17 0015  clindamycin (CLEOCIN) IVPB 900 mg  Status:  Discontinued     900 mg 100 mL/hr over 30 Minutes Intravenous  Once 06/28/17 0005 06/28/17 0029   06/28/17 0015   gentamicin (GARAMYCIN) IVPB 100 mg  Status:  Discontinued     100 mg 200 mL/hr over 30 Minutes Intravenous  Once 06/28/17 0005 06/28/17 0052   06/27/17 2200  metroNIDAZOLE (FLAGYL) tablet 500 mg  Status:  Discontinued     500 mg Oral Every 12 hours 06/27/17 1649 06/28/17 0507   06/27/17 1330  metroNIDAZOLE (FLAGYL) IVPB 500 mg     500 mg 100 mL/hr over 60 Minutes Intravenous  Once 06/27/17 1317 06/27/17 1330       Assessment/Plan H/o ROBOTIC ASSISTED TOTAL LAPAROSCOPICHYSTERECTOMY, Bilateral Salpingectomy, UTEROSACRAL LIGAMENT SUSPENSION 6/26 Dr. Dellis Filbert  Postop bleeding S/p REPAIR VAGINAL CUFF 7/6 Dr. Dellis Filbert  S/p VAGINAL EXAM UNDER ANESTHESIA,LAPAROSCOPY DIAGNOSTIC, EXPLORATORY LAPAROTOMY WITH EVACUATION OF HEMATOMA7/7/18 Dr. Ninfa Linden and Dr. Dellis Filbert - POD 6 - WBC still elevated 23.8; afebrile - drain with 17cc/24hr purulent - CT scan 7/10 showed ileus vs SBO (more likely ileus), pelvic fluid collections concerning for abscess - IR consulted and did not recommend drainage of fluid collections due to small sizes, hopefully JP drain already in place will help to drain them - +BM and flatus. NG is out and she is tolerating full liquids  Anemia - Hg 9.7, monitor Hypokalemia - K 3, K in IVF, will add oral replacement Leukocytosis - WBC remains high 23.8 but patient is afebrile. 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  ID - zosyn 7/12>>, vancomycin 7/12>>, gentamycin 7/7>>7/12, clindamycin 7/7>>7/12, flagyl 7/6>>7/7 FEN - IVF, full liquids VTE - SCDs  Plan - patient is having loose BM's but still has an ileus. Ok to advance to full liquids. Add oral potassium tablets. Continue ambulating. JP drain output low but still purulent, recommend keeping in place and monitoring. If WBC remains elevated over the weekend may need to consider repeat abdominal CT to evaluate abscesses. Labs in  AM.   LOS: 6 days    Jerrye Beavers , [facility] Surgery 07/04/2017, 11:23 AM Pager: [phone] Consults: [phone] Mon-Fri 7:00 am-4:30 pm Sat-Sun 7:00 am-11:30 am

## 2017-07-04 NOTE — Consult Note (Signed)
[facility] for Infectious Disease       Reason for Consult: leukocytosis    Referring Physician: Dr. Dellis Filbert  Active Problems:   Postoperative state   . buPROPion  150 mg Oral BID  . potassium chloride  20 mEq Oral BID    Recommendations: Continued observation on vancomycin and zosyn  Monitor WBC, clinical course Routine screening for HIV  Assessment: She has leukocytosis that was up to 42.2 and has steadily decreased, now leveled off at 23.8 n the setting of recent surgery, abdominal fluid collection and concern for purulent fluid.  It appears to me to be more feculent/biliary rather than abscess but hard to tell. If WBC increases significantly over the next 1-2 days, I agree with surgery for a repeat CT scan but overall it is down from a much higher peak.  The WBC may represent infection but is non-specific and could be reactive to surgery, drainage.  Penicillin allergy - she tolerates pip/tazo so not c/w a penicillin allergy      Chronic hepatitis C - has deferred treatment.  Followed by her PCP  Antibiotics: Vancomycin day 2 Zosyn day 2 Previous clindamycin, gentamicin, flagyl  HPI: Kristine Madden is a 51 y.o. female with complex endometrial hyperplasia with atypica s/p laparoscopic hysterectomy with bilateral salpingectomy, uterosacral ligament suspension done on 5/28 complicated by post op vaginal bleeding with operative ex lap evacuation of hematoma on 7/7 and then post op ileus with fluid collections concerning for abscess with leukocytosis.  When she left the hospital after the initial surgery her WBC was 16.3 and came back with bleeding and was up to 42.2.  She was placed on empiric antibiotics and WBC decreased to 21.5 and now leveled off at 23.8.  She is feeling well, improving ileus, no fever, no chills.  Her drainage has been serosanguinous though now is more feculent.  JP in place. CT independently reviewed and some areas of fluid collection  Review of  Systems:  Constitutional: negative for fevers, chills, fatigue and malaise Integument/breast: negative for rash Musculoskeletal: negative for myalgias and arthralgias All other systems reviewed and are negative    Past Medical History:  Diagnosis Date  . Anxiety   . Arthritis   . 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 Depression   . Hepatitis C   . Hyperlipidemia   . Pericarditis 09/2016    Social History  Substance Use Topics  . Smoking status: Current Every Day Smoker    Packs/day: 0.50    Years: 30.00    Types: Cigarettes  . Smokeless tobacco: Never Used  . Alcohol use 1.8 - 2.4 oz/week    3 - 4 Glasses of wine per week    Family History  Problem Relation Age of Onset  . Arthritis Mother   . Hyperlipidemia Mother   . Hypertension Father     Allergies  Allergen Reactions  . Hydrocodone Nausea And Vomiting  . Penicillins Hives    Has patient had a PCN reaction causing immediate rash, facial/tongue/throat swelling, SOB or lightheadedness with hypotension: No Has patient had a PCN reaction causing severe rash involving mucus membranes or skin necrosis: No Has patient had a PCN reaction that required hospitalization No Has patient had a PCN reaction occurring within the last 10 years: No If all of the above answers are "NO", then may proceed with Cephalosporin use.   Marland Kitchen Percocet [Oxycodone-Acetaminophen] Itching and Nausea And Vomiting    Nausea and vomitting  .  Other Rash    Mango    Physical Exam: Constitutional: in no apparent distress and alert  Vitals:   07/04/17 0851 07/04/17 1153  BP: 133/68 128/78  Pulse: 77 80  Resp: 16 16  Temp: 98.4 F (36.9 C) 98.5 F (36.9 C)   EYES: anicteric ENMT: no thrush Cardiovascular: Cor RRR Respiratory: CTA B; normal respiratory effort GI: Bowel sounds are normal, liver is not enlarged,soft Musculoskeletal: no pedal edema noted Skin: negatives: no rash Hematologic: no cervical lad  Lab Results  Component  Value Date   WBC 23.8 (H) 07/04/2017   HGB 9.7 (L) 07/04/2017   HCT 28.0 (L) 07/04/2017   MCV 88.9 07/04/2017   PLT 611 (H) 07/04/2017    Lab Results  Component Value Date   CREATININE 0.47 07/04/2017   BUN <5 (L) 07/04/2017   NA 133 (L) 07/04/2017   K 3.0 (L) 07/04/2017   CL 97 (L) 07/04/2017   CO2 27 07/04/2017    Lab Results  Component Value Date   ALT 14 07/04/2017   AST 21 07/04/2017   ALKPHOS 57 07/04/2017     Microbiology: No results found for this or any previous visit (from the past 240 hour(s)).  Scharlene Gloss, [facility] [place] [email or web address] [phone] pager  [phone] cell 07/04/2017, 1:25 PM

## 2017-07-04 NOTE — Progress Notes (Addendum)
POD#7  Vanco/Zosyn IV/JP drain/H/O Splenectomy Subjective: Patient reports:  Good passage of gas, 1 stool in last 24 hrs.  Clear fluids tolerated yesterday.  No N/V.  Feels abdomen is soft.  No abdo-pelvic pain.  Ambulates a lot and feels stronger when doing so.  No chills.  No SOB, no palpitation.  Good mictions.    Objective: I have reviewed patient's vital signs.  vital signs, intake and output, medications and labs.  Vitals:   07/03/17 2349 07/04/17 0342  BP: 137/84 135/87  Pulse: 77 86  Resp: 18 18  Temp: 98.7 F (37.1 C) 98.9 F (37.2 C)   I/O last 3 completed shifts: In: 4661.6 [P.O.:1700; I.V.:2495.2; IV Piggyback:466.4] Out: 8248 [Urine:7300; Emesis/NG output:900; Drains:47; Stool:1] No intake/output data recorded.  Results for orders placed or performed during the hospital encounter of 06/27/17 (from the past 24 hour(s))  Comprehensive metabolic panel     Status: Abnormal   Collection Time: 07/04/17  5:02 AM  Result Value Ref Range   Sodium 133 (L) 135 - 145 mmol/L   Potassium 3.0 (L) 3.5 - 5.1 mmol/L   Chloride 97 (L) 101 - 111 mmol/L   CO2 27 22 - 32 mmol/L   Glucose, Bld 121 (H) 65 - 99 mg/dL   BUN <5 (L) 6 - 20 mg/dL   Creatinine, Ser 0.47 0.44 - 1.00 mg/dL   Calcium 7.6 (L) 8.9 - 10.3 mg/dL   Total Protein 5.9 (L) 6.5 - 8.1 g/dL   Albumin 2.5 (L) 3.5 - 5.0 g/dL   AST 21 15 - 41 U/L   ALT 14 14 - 54 U/L   Alkaline Phosphatase 57 38 - 126 U/L   Total Bilirubin 0.6 0.3 - 1.2 mg/dL   GFR calc non Af Amer >60 >60 mL/min   GFR calc Af Amer >60 >60 mL/min   Anion gap 9 5 - 15  CBC with Differential/Platelet     Status: Abnormal   Collection Time: 07/04/17  5:02 AM  Result Value Ref Range   WBC 23.8 (H) 4.0 - 10.5 K/uL   RBC 3.15 (L) 3.87 - 5.11 MIL/uL   Hemoglobin 9.7 (L) 12.0 - 15.0 g/dL   HCT 28.0 (L) 36.0 - 46.0 %   MCV 88.9 78.0 - 100.0 fL   MCH 30.8 26.0 - 34.0 pg   MCHC 34.6 30.0 - 36.0 g/dL   RDW 14.5 11.5 - 15.5 %   Platelets 611 (H) 150 - 400 K/uL    Neutrophils Relative % 75 %   Neutro Abs 17.8 (H) 1.7 - 7.7 K/uL   Lymphocytes Relative 13 %   Lymphs Abs 3.2 0.7 - 4.0 K/uL   Monocytes Relative 7 %   Monocytes Absolute 1.6 (H) 0.1 - 1.0 K/uL   Eosinophils Relative 5 %   Eosinophils Absolute 1.2 (H) 0.0 - 0.7 K/uL   Basophils Relative 0 %   Basophils Absolute 0.0 0.0 - 0.1 K/uL   Platelets/WBC= 25.7 (Good ratio in Splenectomy patient)  EXAM General: alert and cooperative Resp: clear to auscultation bilaterally Cardio: regular rate and rhythm GI: soft, non-tender; bowel sounds normal; no masses,  no organomegaly Extremities: no edema, redness or tenderness in the calves or thighs Vaginal Bleeding: none  Assessment/Plan: s/p Procedure(s): VAGINAL EXAM UNDER ANESTHESIA,LAPAROSCOPY DIAGNOSTIC, EXPLORATORY LAPAROTOMY WITH EVACUATION OF HEMATOMA: progressing well   1) Hemodynamically stable.  Hb stable at 9.7 . 2) Resolved Ileus, tolerating clear diet, will progress slowly to Full liquids today.  Continue frequent ambulation and  sitting position . 3) Abdo-Pelvic Infection/Resolving Abscess:  Afebrile, but WBC count still at 23.8 (stable x yesterday), Neutro increased at 17.8%.  S/P Splenectomy.  Switched to Vanco/Zosyn IV 24 hours ago.  Does not appear septic.  Ratio of Platelets/WBCs at 25.7 (Infection process less likely with ratio >20 in Splenectomy patients?).  Drain only 47 cc in last 24 hours, brownish aspect related to old hematoma also part of drainage?  No blood cultures or abscess culture done.  Decision to consult Infectious Disease for guidelines on how to best manage and interpret high WBC count in afebrile, clinically well patient with s/p Splenectomy.  Discussed with Dr Michel Bickers.  Dr Talbot Grumbling to evaluate patient later today.  Removal of drain:  Would appreciate General Surgery input.  Brown aspect probably old blood.  Only 47 cc in 24 hrs.  4)  Improved K+ at 3.0 today.  Continue KCL in IV fluids.   LOS: 6 days     [name], MD 07/04/2017 7:18 AM    07/04/2017, 7:18 AM

## 2017-07-05 DIAGNOSIS — T814XXS Infection following a procedure, sequela: Secondary | ICD-10-CM

## 2017-07-05 DIAGNOSIS — K9189 Other postprocedural complications and disorders of digestive system: Secondary | ICD-10-CM

## 2017-07-05 LAB — CBC WITH DIFFERENTIAL/PLATELET
BASOS ABS: 0 10*3/uL (ref 0.0–0.1)
BASOS PCT: 0 %
EOS PCT: 5 %
Eosinophils Absolute: 1 10*3/uL — ABNORMAL HIGH (ref 0.0–0.7)
HCT: 26.5 % — ABNORMAL LOW (ref 36.0–46.0)
HEMOGLOBIN: 9.4 g/dL — AB (ref 12.0–15.0)
LYMPHS PCT: 11 %
Lymphs Abs: 2.1 10*3/uL (ref 0.7–4.0)
MCH: 31.5 pg (ref 26.0–34.0)
MCHC: 35.5 g/dL (ref 30.0–36.0)
MCV: 88.9 fL (ref 78.0–100.0)
Monocytes Absolute: 2.1 10*3/uL — ABNORMAL HIGH (ref 0.1–1.0)
Monocytes Relative: 11 %
NEUTROS PCT: 73 %
Neutro Abs: 14.2 10*3/uL — ABNORMAL HIGH (ref 1.7–7.7)
OTHER: 0 %
Platelets: 663 10*3/uL — ABNORMAL HIGH (ref 150–400)
RBC: 2.98 MIL/uL — ABNORMAL LOW (ref 3.87–5.11)
RDW: 14.6 % (ref 11.5–15.5)
WBC: 19.4 10*3/uL — ABNORMAL HIGH (ref 4.0–10.5)

## 2017-07-05 LAB — COMPREHENSIVE METABOLIC PANEL
ALK PHOS: 55 U/L (ref 38–126)
ALT: 13 U/L — AB (ref 14–54)
AST: 17 U/L (ref 15–41)
Albumin: 2.2 g/dL — ABNORMAL LOW (ref 3.5–5.0)
Anion gap: 6 (ref 5–15)
BILIRUBIN TOTAL: 0.7 mg/dL (ref 0.3–1.2)
BUN: <5 mg/dL — ABNORMAL LOW (ref 6–20)
CALCIUM: 7.9 mg/dL — AB (ref 8.9–10.3)
CO2: 27 mmol/L (ref 22–32)
CREATININE: 0.42 mg/dL — AB (ref 0.44–1.00)
Chloride: 102 mmol/L (ref 101–111)
Glucose, Bld: 80 mg/dL (ref 65–99)
Potassium: 4.1 mmol/L (ref 3.5–5.1)
Sodium: 135 mmol/L (ref 135–145)
TOTAL PROTEIN: 5.2 g/dL — AB (ref 6.5–8.1)

## 2017-07-05 LAB — HIV ANTIBODY (ROUTINE TESTING W REFLEX): HIV SCREEN 4TH GENERATION: NONREACTIVE

## 2017-07-05 LAB — VANCOMYCIN, TROUGH: VANCOMYCIN TR: 4 ug/mL — AB (ref 15–20)

## 2017-07-05 MED ORDER — PHENOL 1.4 % MT LIQD
1.0000 | OROMUCOSAL | Status: DC | PRN
  Filled 2017-07-05: qty 177

## 2017-07-05 MED ORDER — ENSURE SURGERY PO LIQD
237.0000 mL | Freq: Two times a day (BID) | ORAL | Status: DC
  Administered 2017-07-05 (×2): 237 mL via ORAL
  Filled 2017-07-05 (×4): qty 237

## 2017-07-05 MED ORDER — SACCHAROMYCES BOULARDII 250 MG PO CAPS
250.0000 mg | ORAL_CAPSULE | Freq: Two times a day (BID) | ORAL | Status: DC
  Administered 2017-07-05 (×2): 250 mg via ORAL
  Filled 2017-07-05 (×3): qty 1

## 2017-07-05 MED ORDER — LIP MEDEX EX OINT
1.0000 "application " | TOPICAL_OINTMENT | Freq: Two times a day (BID) | CUTANEOUS | Status: DC
  Administered 2017-07-05 (×2): 1 via TOPICAL
  Filled 2017-07-05: qty 7

## 2017-07-05 MED ORDER — GUAIFENESIN-DM 100-10 MG/5ML PO SYRP
10.0000 mL | ORAL_SOLUTION | ORAL | Status: DC | PRN
  Filled 2017-07-05: qty 10

## 2017-07-05 MED ORDER — DIPHENHYDRAMINE HCL 50 MG/ML IJ SOLN: 12.5000 mg | Freq: Four times a day (QID) | INTRAMUSCULAR | Status: DC | PRN

## 2017-07-05 MED ORDER — MAGIC MOUTHWASH
15.0000 mL | Freq: Four times a day (QID) | ORAL | Status: DC | PRN
  Filled 2017-07-05: qty 15

## 2017-07-05 MED ORDER — HYDROCORTISONE 2.5 % RE CREA
1.0000 "application " | TOPICAL_CREAM | Freq: Four times a day (QID) | RECTAL | Status: DC | PRN
  Administered 2017-07-05: 1 via TOPICAL
  Filled 2017-07-05 (×2): qty 28.35

## 2017-07-05 MED ORDER — HYDROCORTISONE 1 % EX CREA
1.0000 "application " | TOPICAL_CREAM | Freq: Three times a day (TID) | CUTANEOUS | Status: DC | PRN
  Filled 2017-07-05: qty 28

## 2017-07-05 MED ORDER — POLYETHYLENE GLYCOL 3350 17 G PO PACK: 17.0000 g | PACK | Freq: Two times a day (BID) | ORAL | Status: DC | PRN

## 2017-07-05 MED ORDER — BISACODYL 10 MG RE SUPP: 10.0000 mg | Freq: Two times a day (BID) | RECTAL | Status: DC | PRN

## 2017-07-05 MED ORDER — MENTHOL 3 MG MT LOZG: 1.0000 | LOZENGE | OROMUCOSAL | Status: DC | PRN

## 2017-07-05 MED ORDER — ALUM & MAG HYDROXIDE-SIMETH 200-200-20 MG/5ML PO SUSP: 30.0000 mL | Freq: Four times a day (QID) | ORAL | Status: DC | PRN

## 2017-07-05 NOTE — Progress Notes (Addendum)
POD#7 1/2  Vanco/Zosyn IV day #2 Subjective: Patient reports tolerating PO, + flatus and + BM.  Feeling much better today, much more energy and wanted to eat cereals, which is what she likes for breakfast.  Ambulating +++.  Mictions normal.  ROS neg.   Objective: I have reviewed patient's vital signs.  vital signs, intake and output, medications and labs.  Vitals:   07/05/17 0004 07/05/17 0436  BP: 139/74 120/63  Pulse: 79 74  Resp: 18 18  Temp: 99 F (37.2 C) 98.9 F (37.2 C)   I/O last 3 completed shifts: In: 5534.1 [P.O.:2260; I.V.:2457.7; IV Piggyback:816.4] Out: 7322 [Urine:5000; Drains:19] No intake/output data recorded.  Results for orders placed or performed during the hospital encounter of 06/27/17 (from the past 24 hour(s))  CBC with Differential/Platelet     Status: Abnormal   Collection Time: 07/05/17  5:03 AM  Result Value Ref Range   WBC 19.4 (H) 4.0 - 10.5 K/uL   RBC 2.98 (L) 3.87 - 5.11 MIL/uL   Hemoglobin 9.4 (L) 12.0 - 15.0 g/dL   HCT 26.5 (L) 36.0 - 46.0 %   MCV 88.9 78.0 - 100.0 fL   MCH 31.5 26.0 - 34.0 pg   MCHC 35.5 30.0 - 36.0 g/dL   RDW 14.6 11.5 - 15.5 %   Platelets 663 (H) 150 - 400 K/uL   Neutrophils Relative % 73 %   Lymphocytes Relative 11 %   Monocytes Relative 11 %   Eosinophils Relative 5 %   Basophils Relative 0 %   Other 0 %   Neutro Abs 14.2 (H) 1.7 - 7.7 K/uL   Lymphs Abs 2.1 0.7 - 4.0 K/uL   Monocytes Absolute 2.1 (H) 0.1 - 1.0 K/uL   Eosinophils Absolute 1.0 (H) 0.0 - 0.7 K/uL   Basophils Absolute 0.0 0.0 - 0.1 K/uL  Comprehensive metabolic panel     Status: Abnormal   Collection Time: 07/05/17  5:03 AM  Result Value Ref Range   Sodium 135 135 - 145 mmol/L   Potassium 4.1 3.5 - 5.1 mmol/L   Chloride 102 101 - 111 mmol/L   CO2 27 22 - 32 mmol/L   Glucose, Bld 80 65 - 99 mg/dL   BUN <5 (L) 6 - 20 mg/dL   Creatinine, Ser 0.42 (L) 0.44 - 1.00 mg/dL   Calcium 7.9 (L) 8.9 - 10.3 mg/dL   Total Protein 5.2 (L) 6.5 - 8.1 g/dL   Albumin 2.2 (L) 3.5 - 5.0 g/dL   AST 17 15 - 41 U/L   ALT 13 (L) 14 - 54 U/L   Alkaline Phosphatase 55 38 - 126 U/L   Total Bilirubin 0.7 0.3 - 1.2 mg/dL   GFR calc non Af Amer >60 >60 mL/min   GFR calc Af Amer >60 >60 mL/min   Anion gap 6 5 - 15    EXAM General: alert and cooperative, more enthusiastic, smiling. Resp: clear to auscultation bilaterally Cardio: regular rate and rhythm GI: soft, non-tender; bowel sounds normal; no masses,  no organomegaly.  Incisions intact. Extremities: Homans sign is negative, no sign of DVT Vaginal Bleeding: none  Assessment: s/p Procedure(s): VAGINAL EXAM UNDER ANESTHESIA,LAPAROSCOPY DIAGNOSTIC, EXPLORATORY LAPAROTOMY WITH EVACUATION OF HEMATOMA: progressing well  1) Resolved Ileus.  Progress to soft diet.  High protein, Ca++ recommended.  2) Abdo-Pelvic abscess responding to Vanco/Zosyn IV.  Afebrile and Decreased WBC count from 23.8 to 19.4 this am.  Drain only 19 cc in last 24 hours and pinkish clear.  Note that I think the brown granular texture we were seeing for a few days in the drain was probably the Surgicel decomposing and being suctioned by the drain, as Dr Ninfa Linden and myself sat the drain right on 2 layers of Surgicel at the time of Laparotomy.  Will discuss removing the drain either today or tomorrow with General Surgery.  3) Hypokalemia resolved.  K+ 4.0 this morning.  Will d/c IV KCL and put the IV at [facility].  4) Hemodynamically stable with stable Hb at 9.4.   Phone conversation with General Surgery Dr Neysa Bonito who agrees that JP drain can be removed today.  JP drain easily removed.  Entrance point cleaned and steri-strips applied.  Covered with a 4x4.  Patient reported a localized back rash with pruritus.  CS cream prescribed for comfort.  Observing.  Not likely an allergic reaction, more probably contact dermatitis.  Per Dr Hale Bogus note, stopping Vanco based on Gram stain not likely MRSA.  Continuing on Zosyn.  Pending final  cultures.   LOS: 7 days    [name], MD 07/05/2017 7:13 AM    07/05/2017, 7:13 AM

## 2017-07-05 NOTE — Progress Notes (Signed)
Patient ID: Kristine Madden, female   DOB: 1966/01/06, 51 y.o.   MRN: [id number]          [facility]    Date of Admission:  06/27/2017    Total days of antibiotics 9        Day 3 vancomycin and piperacillin tazobactam  [name] remains on empiric therapy for postoperative pelvic abscess. She remains afebrile and her white blood cell count is slowly coming back down toward normal. A specimen, which I presume is from her postoperative drain, was submitted yesterday for Gram stain and culture. Gram stain shows mixed bacterial flora compatible with mixed aerobic/anaerobic infection. MRSA is unlikely so I will stop vancomycin. I will continue piperacillin tazobactam for now. Depending on her progress and culture results we can consider discharge on oral amoxicillin clavulanate.         Michel Bickers, MD [facility] [place] [facility] [phone] pager   [phone] cell 07/05/2017, 9:31 AM

## 2017-07-05 NOTE — Progress Notes (Deleted)
Patient admitted to [facility]. Waiting on CRNA to start IV access for patient.  Patient tearfully states that she is scared of Cytotec and the plan of care.  Patient has many questions regarding pain management.  Patient states she is only willing to take ibuprofen or an epidural for pain management.  Cytotec not started at this time as no IV access at this time and patient expressing concerns about taking the Cytotec.  Physician and attending notified of patient's concern.  Physician to come to unit to discuss plan of care with unit.

## 2017-07-06 DIAGNOSIS — T814XXS Infection following a procedure, sequela: Secondary | ICD-10-CM

## 2017-07-06 DIAGNOSIS — K9189 Other postprocedural complications and disorders of digestive system: Secondary | ICD-10-CM

## 2017-07-06 LAB — COMPREHENSIVE METABOLIC PANEL
ALT: 15 U/L (ref 14–54)
ANION GAP: 6 (ref 5–15)
AST: 18 U/L (ref 15–41)
Albumin: 2.4 g/dL — ABNORMAL LOW (ref 3.5–5.0)
Alkaline Phosphatase: 55 U/L (ref 38–126)
BUN: 5 mg/dL — ABNORMAL LOW (ref 6–20)
CHLORIDE: 101 mmol/L (ref 101–111)
CO2: 30 mmol/L (ref 22–32)
Calcium: 8.3 mg/dL — ABNORMAL LOW (ref 8.9–10.3)
Creatinine, Ser: 0.48 mg/dL (ref 0.44–1.00)
Glucose, Bld: 98 mg/dL (ref 65–99)
POTASSIUM: 3.5 mmol/L (ref 3.5–5.1)
SODIUM: 137 mmol/L (ref 135–145)
Total Bilirubin: 0.3 mg/dL (ref 0.3–1.2)
Total Protein: 5.9 g/dL — ABNORMAL LOW (ref 6.5–8.1)

## 2017-07-06 LAB — CBC WITH DIFFERENTIAL/PLATELET
BAND NEUTROPHILS: 0 %
BASOS ABS: 0.1 10*3/uL (ref 0.0–0.1)
BLASTS: 0 %
Basophils Relative: 1 %
EOS ABS: 1.1 10*3/uL — AB (ref 0.0–0.7)
EOS PCT: 10 %
HCT: 26.4 % — ABNORMAL LOW (ref 36.0–46.0)
Hemoglobin: 9.2 g/dL — ABNORMAL LOW (ref 12.0–15.0)
LYMPHS ABS: 2.3 10*3/uL (ref 0.7–4.0)
LYMPHS PCT: 20 %
MCH: 31.2 pg (ref 26.0–34.0)
MCHC: 34.8 g/dL (ref 30.0–36.0)
MCV: 89.5 fL (ref 78.0–100.0)
METAMYELOCYTES PCT: 0 %
MONOS PCT: 17 %
Monocytes Absolute: 1.9 10*3/uL — ABNORMAL HIGH (ref 0.1–1.0)
Myelocytes: 0 %
Neutro Abs: 6 10*3/uL (ref 1.7–7.7)
Neutrophils Relative %: 52 %
OTHER: 0 %
PLATELETS: 724 10*3/uL — AB (ref 150–400)
Promyelocytes Absolute: 0 %
RBC: 2.95 MIL/uL — AB (ref 3.87–5.11)
RDW: 14.6 % (ref 11.5–15.5)
WBC: 11.4 10*3/uL — ABNORMAL HIGH (ref 4.0–10.5)
nRBC: 0 /100 WBC

## 2017-07-06 MED ORDER — SODIUM CHLORIDE 0.9% FLUSH: 3.0000 mL | INTRAVENOUS | Status: DC | PRN

## 2017-07-06 MED ORDER — AMOXICILLIN-POT CLAVULANATE 875-125 MG PO TABS: 1.0000 | ORAL_TABLET | Freq: Two times a day (BID) | ORAL | 0 refills | Status: AC

## 2017-07-06 NOTE — Discharge Instructions (Signed)
Exploratory Laparotomy, Adult, Care After °Refer to this sheet in the next few weeks. These instructions provide you with information about caring for yourself after your procedure. Your health care provider may also give you more specific instructions. Your treatment has been planned according to current medical practices, but problems sometimes occur. Call your health care provider if you have any problems or questions after your procedure. °What can I expect after the procedure? °After your procedure, it is typical to have: °· Abdominal soreness. °· Fatigue. °· A sore throat from tubes in your throat. °· A lack of appetite. ° °Follow these instructions at home: °Medicines °· Take medicines only as directed by your health care provider. °· Do not drive or operate heavy machinery while taking pain medicine. °Incision care °· There are many different ways to close and cover an incision, including stitches (sutures), skin glue, and adhesive strips. Follow your health care provider's instructions about: °? Incision care. °? Bandage (dressing) changes and removal. °? Incision closure removal. °· Do not take showers or baths until your health care provider says that you can. °· Check your incision area daily for signs of infection. Watch for: °? Redness. °? Tenderness. °? Swelling. °? Drainage. °Activity °· Do not lift anything that is heavier than 10 pounds (4.5 kg) until your health care provider says that it is safe. °· Try to walk a little bit each day if your health care provider says that it is okay. °· Ask your health care provider when you can start to do your usual activities again, such as driving, going back to work, and having sex. °Eating and drinking °· You may eat what you usually eat. Include lots of whole grains, fruits, and vegetables in your diet. This will help to prevent constipation. °· Drink enough fluid to keep your urine clear or pale yellow. °General instructions °· Keep all follow-up visits as  directed by your health care provider. This is important. °Contact a health care provider if: °· You have a fever. °· You have chills. °· Your pain medicine is not helping. °· You have constipation or diarrhea. °· You have nausea or vomiting. °· You have drainage, redness, swelling, or pain at your incision site. °Get help right away if: °· Your pain is getting worse. °· It has been more than 3 days since you been able to have a bowel movement. °· You have ongoing (persistent) vomiting. °· The edges of your incision open up. °· You have warmth, tenderness, and swelling in your calf. °· You have trouble breathing. °· You have chest pain. °This information is not intended to replace advice given to you by your health care provider. Make sure you discuss any questions you have with your health care provider. °Document Released: 07/23/2004 Document Revised: 05/16/2016 Document Reviewed: 07/27/2014 °Elsevier Interactive Patient Education © 2018 [employer] ° °

## 2017-07-06 NOTE — Discharge Summary (Addendum)
Kristine Madden September 14, 1966 [id number]  Discharge Summary  Date of Admission:  06/27/2017  Date of Discharge:  07/06/2017  Discharge Diagnosis:  Late Postoperative abdomino-pelvic Hematoma and Abscess, Postoperative Ileus.  Procedure:  Procedure(s): VAGINAL EXAM UNDER ANESTHESIA,LAPAROSCOPY DIAGNOSTIC, EXPLORATORY LAPAROTOMY WITH EVACUATION OF HEMATOMA  Treatment:  IV Antibiotherapy.  D/C on Augmentin PO x 14 days.  Pathology:  None  Abscess Cultures (Drain Fluid):  Pending  Hospital Course:  Good Post op course, but complicated by Ileus.  Hemodynamically stable Post op.  Good response to IV Antibiotherapy. The patient received instructions for postoperative care and call precautions.  She received prescriptions per AVS and will be seen in the office 1+ week following discharge.     [name] MD, 9:44 AM 07/06/2017

## 2017-07-06 NOTE — Progress Notes (Signed)
POD#8 1/2.  Zosyn IV Subjective: Patient reports tolerating PO, + flatus and + BM.  Feeling normal!  No complaint.  Objective: I have reviewed patient's vital signs.  vital signs, intake and output, medications, labs and microbiology.  Vitals:   07/06/17 0031 07/06/17 0552  BP: 134/74 115/61  Pulse: 77 65  Resp: 18 18  Temp: 98.3 F (36.8 C) 98.3 F (36.8 C)   I/O last 3 completed shifts: In: 560 [P.O.:560] Out: 7 [Drains:7] No intake/output data recorded.  Results for orders placed or performed during the hospital encounter of 06/27/17 (from the past 24 hour(s))  Vancomycin, trough     Status: Abnormal   Collection Time: 07/05/17  9:11 AM  Result Value Ref Range   Vancomycin Tr 4 (L) 15 - 20 ug/mL  CBC with Differential/Platelet     Status: Abnormal   Collection Time: 07/06/17  5:02 AM  Result Value Ref Range   WBC 11.4 (H) 4.0 - 10.5 K/uL   RBC 2.95 (L) 3.87 - 5.11 MIL/uL   Hemoglobin 9.2 (L) 12.0 - 15.0 g/dL   HCT 26.4 (L) 36.0 - 46.0 %   MCV 89.5 78.0 - 100.0 fL   MCH 31.2 26.0 - 34.0 pg   MCHC 34.8 30.0 - 36.0 g/dL   RDW 14.6 11.5 - 15.5 %   Platelets 724 (H) 150 - 400 K/uL   Neutrophils Relative % 52 %   Lymphocytes Relative 20 %   Monocytes Relative 17 %   Eosinophils Relative 10 %   Basophils Relative 1 %   Band Neutrophils 0 %   Metamyelocytes Relative 0 %   Myelocytes 0 %   Promyelocytes Absolute 0 %   Blasts 0 %   nRBC 0 0 /100 WBC   Other 0 %   Neutro Abs 6.0 1.7 - 7.7 K/uL   Lymphs Abs 2.3 0.7 - 4.0 K/uL   Monocytes Absolute 1.9 (H) 0.1 - 1.0 K/uL   Eosinophils Absolute 1.1 (H) 0.0 - 0.7 K/uL   Basophils Absolute 0.1 0.0 - 0.1 K/uL  Comprehensive metabolic panel     Status: Abnormal   Collection Time: 07/06/17  5:02 AM  Result Value Ref Range   Sodium 137 135 - 145 mmol/L   Potassium 3.5 3.5 - 5.1 mmol/L   Chloride 101 101 - 111 mmol/L   CO2 30 22 - 32 mmol/L   Glucose, Bld 98 65 - 99 mg/dL   BUN 5 (L) 6 - 20 mg/dL   Creatinine, Ser 0.48  0.44 - 1.00 mg/dL   Calcium 8.3 (L) 8.9 - 10.3 mg/dL   Total Protein 5.9 (L) 6.5 - 8.1 g/dL   Albumin 2.4 (L) 3.5 - 5.0 g/dL   AST 18 15 - 41 U/L   ALT 15 14 - 54 U/L   Alkaline Phosphatase 55 38 - 126 U/L   Total Bilirubin 0.3 0.3 - 1.2 mg/dL   GFR calc non Af Amer >60 >60 mL/min   GFR calc Af Amer >60 >60 mL/min   Anion gap 6 5 - 15   Aerobic and Anaerobic Abscess (Drainage fluid) Cultures still pending.  EXAM General: alert and cooperative Resp: clear to auscultation bilaterally Cardio: regular rate and rhythm GI: soft, non-tender; bowel sounds normal; no masses,  no organomegaly and incision: clean, dry and intact Extremities: no edema, redness or tenderness in the calves or thighs Vaginal Bleeding: none  Assessment/Plan: s/p Procedure(s): VAGINAL EXAM UNDER ANESTHESIA,LAPAROSCOPY DIAGNOSTIC, EXPLORATORY LAPAROTOMY WITH EVACUATION OF HEMATOMA: progressing  well  1) Ileus resolved.  Tolerating soft diet. Will progress slowly at home with regular diet.  High Protein/Ca++/K+ recommended.  2) Abdo-Pelvic Abscess responded well to Zosyn.  Abscess (Drain Fluid) Cultures still pending.  Afebrile/Pulse back to normal 65/WBC count down to 11.4 (Neutro 52%, 6.0 normal).  Drain removed yesterday.  Will d/c on Augmentin x 14 days (per Dr Hale Bogus recommendation).  No evidence of "cillin" allergy given no allergic reaction to Zosyn and had Augmentin in the past without Allergic reaction.  3) K+ 3.5 wnl.  K+ rich diet recommended.  4) Hemodynamically stable with Anemia, Hb 9.2.  FeSO4 325 mg PO BID recommended.  Thank you to consultants in General Surgery and Infectious Disease.  Discharge home/Prescription of Augmentin sent to Pharmacy/F/U with me in office in 1+ week.  LOS: 8 days    Kristine Bruins, MD 07/06/2017 8:16 AM    07/06/2017, 8:16 AM

## 2017-07-06 NOTE — Progress Notes (Signed)
Discharge instructions and paperwork reviewed with patient.  New medications, incision care and follow ups discussed.

## 2017-07-09 ENCOUNTER — Encounter: Payer: Self-pay | Admitting: Anesthesiology

## 2017-07-09 LAB — AEROBIC CULTURE  (SUPERFICIAL SPECIMEN)

## 2017-07-09 LAB — ANAEROBIC CULTURE

## 2017-07-09 LAB — AEROBIC CULTURE W GRAM STAIN (SUPERFICIAL SPECIMEN)
Culture: NORMAL
Special Requests: NORMAL

## 2017-07-17 ENCOUNTER — Ambulatory Visit (INDEPENDENT_AMBULATORY_CARE_PROVIDER_SITE_OTHER): Payer: BLUE CROSS/BLUE SHIELD | Admitting: Obstetrics & Gynecology

## 2017-07-17 ENCOUNTER — Encounter: Payer: Self-pay | Admitting: Obstetrics & Gynecology

## 2017-07-17 VITALS — BP 128/80 | Temp 97.4°F

## 2017-07-17 DIAGNOSIS — Z09 Encounter for follow-up examination after completed treatment for conditions other than malignant neoplasm: Secondary | ICD-10-CM

## 2017-07-17 LAB — CBC WITH DIFFERENTIAL/PLATELET
BASOS ABS: 148 {cells}/uL (ref 0–200)
Basophils Relative: 2 %
EOS ABS: 518 {cells}/uL — AB (ref 15–500)
Eosinophils Relative: 7 %
HEMATOCRIT: 32.5 % — AB (ref 35.0–45.0)
HEMOGLOBIN: 10.4 g/dL — AB (ref 11.7–15.5)
LYMPHS ABS: 2516 {cells}/uL (ref 850–3900)
Lymphocytes Relative: 34 %
MCH: 30.1 pg (ref 27.0–33.0)
MCHC: 32 g/dL (ref 32.0–36.0)
MCV: 93.9 fL (ref 80.0–100.0)
MPV: 9.7 fL (ref 7.5–12.5)
Monocytes Absolute: 1332 cells/uL — ABNORMAL HIGH (ref 200–950)
Monocytes Relative: 18 %
NEUTROS ABS: 2886 {cells}/uL (ref 1500–7800)
Neutrophils Relative %: 39 %
Platelets: 592 10*3/uL — ABNORMAL HIGH (ref 140–400)
RBC: 3.46 MIL/uL — ABNORMAL LOW (ref 3.80–5.10)
RDW: 14.6 % (ref 11.0–15.0)
WBC: 7.4 10*3/uL (ref 3.8–10.8)

## 2017-07-17 NOTE — Patient Instructions (Signed)
1. Follow-up examination after gynecological surgery Good evolution on Augmentin.  Will finish ABTx on 7/29th.  No sign of infection, no bleeding.  Will repeat CBC with Diff today for reassurance.  F/U 3 wks to reassess healing. - CBC w/Diff  Very good to see you and Ted today Marcella!  I will inform you of the CBC result as soon as available.

## 2017-07-17 NOTE — Progress Notes (Signed)
    Kristine Madden May 09, 1966 [id number]        51 y.o.  G1P1L1  Accompanied by spouse, Kristine Madden.  RP:  Postop follow-up complicated by Pelvic abscess and hematoma  Postop readmission:  Patient had late, 8-10 days post op Robotic TLH/Bilat. Salpingect Cx with vaginal bleeding.  Had partial dehiscence of vaginal vault repaired on 06/27/2017, but postop vaginal bleeding persisted and a Laparotomy through a Pfannenstiel incision was performed on 06/28/2017 which revealed a pelvic abscess with bowel adhesions and a hematoma.  The hematoma was evacuated, hemostasis completed, irrigation and suction and a J-P drain was put in place.  Postop course was complicated by an Ileus which eventually resolved.  Patient responded to Vanco/Zosyn IV and was d/c on Augmentin.  The Anaerobic Culture (Drain fluid) grew Prevolella Betalactamase pos.  HPI:  At home since 7/15th, will stay on Augmentin until 7/29th.  No abdo-pelvic pain.  Incisions intact.  No vaginal bleeding or d/c.  No chills, no fever.  Eating well, no vomitting, BMs normal.  Mictions normal.  Gained 5 Lbs since d/c, which she needed.    Past medical history,surgical history, problem list, medications, allergies, family history and social history were all reviewed and documented in the EPIC chart.  Directed ROS with pertinent positives and negatives documented in the history of present illness/assessment and plan.  Exam:  Vitals:   07/17/17 0950  BP: 128/80  Temp: (!) 97.4 F (36.3 C)  TempSrc: Oral   General appearance:  Normal  Abdo:  Soft, non-tender, no distention.  Incisions intact.  Mild induration at Pfannenstiel incision.  No erythema, no dehiscence.  Gyn exam:  Vulva normal.                       Speculum and VE:  Vaginal vault intact, no dehiscence, no bleeding, normal secretions.                     Bimanual exam:  No pelvic mass felt, not tender.  Assessment/Plan:  51 y.o. G1P1001   1. Follow-up examination after gynecological  surgery Good evolution on Augmentin.  Will finish ABTx on 7/29th.  No sign of infection, no bleeding.  Will repeat CBC with Diff today for reassurance.  F/U 3 wks to reassess healing. - CBC w/Diff  Kristine Bruins MD, 9:56 AM 07/17/2017

## 2017-07-18 ENCOUNTER — Encounter: Payer: Self-pay | Admitting: *Deleted

## 2017-08-18 ENCOUNTER — Telehealth: Payer: Self-pay

## 2017-08-18 NOTE — Telephone Encounter (Signed)
Patient called asking for an additional week before return to work. 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  Left patient a message on voice mail and let her know.

## 2017-08-20 ENCOUNTER — Ambulatory Visit (INDEPENDENT_AMBULATORY_CARE_PROVIDER_SITE_OTHER): Payer: BLUE CROSS/BLUE SHIELD | Admitting: Obstetrics & Gynecology

## 2017-08-20 ENCOUNTER — Encounter: Payer: Self-pay | Admitting: Obstetrics & Gynecology

## 2017-08-20 VITALS — BP 130/84

## 2017-08-20 DIAGNOSIS — Z09 Encounter for follow-up examination after completed treatment for conditions other than malignant neoplasm: Secondary | ICD-10-CM

## 2017-08-20 NOTE — Patient Instructions (Signed)
Follow-up examination 8 wks after gynecological surgery complicated by delayed postop hemorrhage and pelvic abscess Excellent recovery, completely healed.  Will f/u next year for Gyn/Annual exam.  Up to date with screening Mammo.  Tamzin, it was really great to see you so well today!

## 2017-08-20 NOTE — Progress Notes (Signed)
    Kristine Madden [birth year] [id number]        51 y.o.  G1P1001   RP:  Postop f/u 8 wks after Laparotomy for control of delayed Postop hemorrhage/Pelvic abscess  HPI:  Doing very well.  No abdo-pelvic pain.  No vaginal d/c or bleeding.  BMs normal.  Mictions normal.  No fever.  Good energy level.  Past medical history,surgical history, problem list, medications, allergies, family history and social history were all reviewed and documented in the EPIC chart.  Directed ROS with pertinent positives and negatives documented in the history of present illness/assessment and plan.  Exam:  Vitals:   08/20/17 1402  BP: 130/84   General appearance:  Normal  Abdo:  Soft, NT, not distended.  Incisions all well-healed.  Gyn exam:  Vulva normal.  Speculum:  Vagina normal, vault well-healed.  Normal secretions.                     Bimanual exam:  Vaginal vault well-healed, NT, no mass felt.  Assessment/Plan:  51 y.o. G1P1001   Follow-up examination 8 wks after gynecological surgery complicated by delayed postop hemorrhage and pelvic abscess Excellent recovery, completely healed.  Will f/u next year for Gyn/Annual exam.  Up to date with screening Mammo.  [name] MD, 2:10 PM 08/20/2017

## 2017-09-29 ENCOUNTER — Ambulatory Visit (INDEPENDENT_AMBULATORY_CARE_PROVIDER_SITE_OTHER): Payer: BLUE CROSS/BLUE SHIELD | Admitting: Family Medicine

## 2017-09-29 ENCOUNTER — Encounter: Payer: Self-pay | Admitting: Family Medicine

## 2017-09-29 VITALS — BP 138/88 | HR 102 | Temp 99.3°F | Ht 67.0 in | Wt 134.1 lb

## 2017-09-29 DIAGNOSIS — J0110 Acute frontal sinusitis, unspecified: Secondary | ICD-10-CM | POA: Diagnosis not present

## 2017-09-29 MED ORDER — AMOXICILLIN-POT CLAVULANATE 875-125 MG PO TABS: 1.0000 | ORAL_TABLET | Freq: Two times a day (BID) | ORAL | 0 refills | Status: DC

## 2017-09-29 NOTE — Progress Notes (Signed)
Kristine Madden - 51 y.o. female MRN [id number]  Date of birth: Jul 29, 1966  SUBJECTIVE:  Including CC & ROS.  Chief Complaint  Patient presents with  . Fever    Patient is here today C/O of a fever since yesterday.  Started with cough and throat pain last week but since the throat pain has decreased but all other Sx have increased.  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    Kristine Madden is a 51 year old female is presenting with sinus pain and fever. She reports her symptoms have been present for over a week. Reports her symptoms have been getting worse. Has tried over-the-counter medications. Denies any sick contacts. Had a fever yesterday of 101.7. Has been taking ibuprofen and Tylenol. Denies any cough. Symptoms are severe nature.   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 She was status post hysterectomy. She is status post 13 weeks laparotomy for control of a delayed hemorrhagic/pelvic postop abscess.  Lab work from 07/17/17 shows elevated platelets and anemia.  Review of Systems  Constitutional: Positive for fever.  HENT: Positive for sinus pressure. Negative for ear pain.   Respiratory: Negative for shortness of breath.     HISTORY: Past Medical, Surgical, Social, and Family History Reviewed & Updated per EMR.   Pertinent Historical Findings include:  Past Medical History:  Diagnosis Date  . Anxiety   . Arthritis   . Cancer [facility]) 2010   islet cell tumor  . Depression   . Hepatitis C   . Hyperlipidemia   . Pericarditis 09/2016    Past Surgical History:  Procedure Laterality Date  . DILATATION & CURETTAGE/HYSTEROSCOPY WITH MYOSURE N/A 01/03/2017   Procedure: [place];  Surgeon: [name], MD;  Location: [facility];  Service: Gynecology;  Laterality: N/A;  . EYE SURGERY    . HEMORRHOID SURGERY    . KNEE ARTHROSCOPY    . LAPAROSCOPY N/A 06/28/2017   Procedure: VAGINAL EXAM UNDER  ANESTHESIA,LAPAROSCOPY DIAGNOSTIC, EXPLORATORY LAPAROTOMY WITH EVACUATION OF HEMATOMA;  Surgeon: [name], MD;  Location: [facility];  Service: Gynecology;  Laterality: N/A;  . 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 REPAIR VAGINAL CUFF N/A 06/27/2017   Procedure: REPAIR VAGINAL CUFF;  Surgeon: [name], MD;  Location: [facility];  Service: Gynecology;  Laterality: N/A;  . ROBOTIC ASSISTED TOTAL HYSTERECTOMY WITH UTEROSACRAL LIGAMENT SUSPENSION Bilateral 06/17/2017   Procedure: ROBOTIC ASSISTED TOTAL HYSTERECTOMY, Bilateral Salpingectomy,  UTEROSACRAL LIGAMENT SUSPENSION;  Surgeon: [name], MD;  Location: [facility];  Service: Gynecology;  Laterality: Bilateral;     RNFA confirmed on 05/12/17  Dr. Dellis Filbert assisting in [place] room 3  . SPLENECTOMY, TOTAL    . TONSILLECTOMY    . TUBAL LIGATION      Allergies  Allergen Reactions  . Hydrocodone Nausea And Vomiting  . Penicillins Hives    Has patient had a PCN reaction causing immediate rash, facial/tongue/throat swelling, SOB or lightheadedness with hypotension: No Has patient had a PCN reaction causing severe rash involving mucus membranes or skin necrosis: No Has patient had a PCN reaction that required hospitalization No Has patient had a PCN reaction occurring within the last 10 years: No If all of the above answers are "NO", then may proceed with Cephalosporin use.   Marland Kitchen Percocet [Oxycodone-Acetaminophen] Itching and Nausea And Vomiting    Nausea and vomitting  . Other Rash    Mango    Family History  Problem Relation Age  of Onset  . Arthritis Mother   . Hyperlipidemia Mother   . Hypertension Father      Social History   Social History  . Marital status: Married    Spouse name: N/A  . Number of children: 1  . Years of education: 95   Occupational History  . Software Musician    Social History Main Topics  . Smoking status: Former Smoker    Packs/day: 0.50    Years: 30.00    Types: Cigarettes    . Smokeless tobacco: Never Used  . Alcohol use 1.8 - 2.4 oz/week    3 - 4 Glasses of wine per week  . Drug use: No  . Sexual activity: Not on file   Other Topics Concern  . Not on file   Social History Narrative   Fun: Yoga, Armandina Gemma and dogs.    Denies abuse and feels safe at home.      PHYSICAL EXAM:  VS: BP 138/88 (BP Location: Right Arm, Patient Position: Sitting, Cuff Size: Normal)   Pulse (!) 102   Temp 99.3 F (37.4 C) (Oral)   Ht 5\' 7"  (1.702 m)   Wt 134 lb 1.3 oz (60.8 kg)   SpO2 97%   BMI 21.00 kg/m  Physical Exam Gen: NAD, alert, cooperative with exam,  ENT: normal lips, normal nasal mucosa, tympanic membranes clear and intact bilaterally, no cervical lymphadenopathy, normal oropharynx, tenderness to palpation of the frontal maxillary sinus Eye: normal EOM, normal conjunctiva and lids CV:  no edema, +2 pedal pulses, S1S2 Resp: no accessory muscle use, non-labored, no crackles or wheezes, CLAB Skin: no rashes, no areas of induration  Neuro: normal tone, normal sensation to touch Psych:  normal insight, alert and oriented MSK: normal gait, normal strength       ASSESSMENT & PLAN:   Acute non-recurrent frontal sinusitis This appears to be bacterial in origin. She has had worsening of her symptoms. She has concern is she is had significant abdominal and gynecology surgery with complications recently. She has status post splenectomy. - Augmentin provided - Counseled on supportive care - Given indications to return.

## 2017-09-29 NOTE — Patient Instructions (Signed)
Thank you for coming in,   Please try things such as zyrtec-D or allegra-D which is an antihistamine and decongestant.   Please try afrin which will help with nasal congestion but use for only three days.   Please also try using a netti pot on a regular occasion.  Honey can help with a sore throat.   Please follow up if you don't have any improvement    Please feel free to call with any questions or concerns at any time, at [phone]. --Dr. Raeford Razor

## 2017-09-29 NOTE — Assessment & Plan Note (Signed)
This appears to be bacterial in origin. She has had worsening of her symptoms. She has concern is she is had significant abdominal and gynecology surgery with complications recently. She has status post splenectomy. - Augmentin provided - Counseled on supportive care - Given indications to return.

## 2017-11-12 ENCOUNTER — Telehealth (HOSPITAL_COMMUNITY): Payer: Self-pay

## 2017-11-12 ENCOUNTER — Ambulatory Visit: Payer: BLUE CROSS/BLUE SHIELD | Admitting: Family Medicine

## 2017-11-12 NOTE — Telephone Encounter (Signed)
Copied from [place] [id number]. Topic: General - Other >> Nov 12, 2017 11:34 AM Malena Catholic I, NT wrote:  Reason for CRM:pt Got loss and miss app

## 2017-11-12 NOTE — Patient Instructions (Signed)
Long-Term Care After a Splenectomy A splenectomy is surgery to remove a diseased or injured spleen. The spleen is an organ that is located in the upper left part of the abdomen, just under the ribs. The spleen filters and cleans the blood. It also stores blood cells and destroys cells that are worn out. The spleen is also important for fighting disease. The spleen, along with other body systems and organs, plays an important role in the body's natural defense system (immune system). After your spleen is removed, you have a slightly greater chance of developing a serious, life-threatening infection. The following are some actions that you can take to prevent infection. How can I prevent infection? Your health care provider will recommend actions to help prevent infection. These may include:  Making sure that your immunizations are up-to-date, including: ? Pneumococcus. ? Seasonal flu (influenza). ? Hib (Haemophilus influenzae type b). ? Meningitis.  Making sure that vaccines are up-to-date for your family members.  Following good daily practices to prevent infection, such as: ? Washing your hands often, especially after preparing food, eating, changing diapers, and playing with children or animals. ? Disinfecting surfaces regularly. ? Avoiding people who have active illness or infections.  Taking precautions to avoid insect bites, such as: ? Wearing proper clothing that covers the entire body when you are in wooded or marshy areas. ? Changing clothing right away and checking for bites after you have been outside. ? Using insect spray. ? Using insect netting. ? Staying indoors during hours when mosquitoes are most active.  Taking precautions to avoid dog bites. ? After a splenectomy, you may be at increased risk for rare infections that are associated with dog bites.  What do I need to do if I must travel? If you travel in the [place] take actions to avoid insect bites, especially in  [place] and [place] areas. Insects can carry many viruses, and you may be at an increased risk of becoming sick from these viruses. You should also take precautions if you travel abroad to places where malaria is common. In that case, follow these guidelines:  Contact your health care provider to get specific advice about the places that you will be visiting.  Get specific immunizations to guard against the disease risks in the country that you will be visiting.  Understand how to prevent infections, such as malaria, while you are abroad. These infections can pose serious risk. Precautions may include: ? Daily tablets to prevent malaria. ? Taking other precautions to prevent insect bites.  Bring broad-spectrum antibiotic medicines with you if they have been prescribed.  What other things do I need to remember to do?  Take over-the-counter and prescription medicines only as told by your health care provider.  If you were prescribed an antibiotic: ? Take it as told by your health care provider. ? Do not stop taking the antibiotic even if you start to feel better. ? Talk with your health care provider about the use of a probiotic supplement to prevent stomach upset.  Keep track of medicine refills so you do not run out of medicine.  Inform your close contacts of your condition. Consider wearing a medical alert bracelet or carrying an ID card.  Keep all follow-up visits as told by your health care provider. This is important. When should I seek medical care? Call your health care provider if:  You have a fever.  You have signs of infection that continue after taking an antibiotic. Signs may include  chills and feeling unwell.  You are considering travel abroad.  You are bitten by a tick or a dog.  When should I seek immediate medical care? Call 911 or go to the emergency department if:  You have chest pain along with: ? Shortness of breath. ? Pain in the back, neck, or  jaw.  You have pain or swelling in the leg.  You develop a sudden headache and dizziness.  This information is not intended to replace advice given to you by your health care provider. Make sure you discuss any questions you have with your health care provider. Document Released: 05/29/2010 Document Revised: 05/13/2016 Document Reviewed: 08/22/2015 Elsevier Interactive Patient Education  2018 [employer].

## 2017-11-12 NOTE — Telephone Encounter (Signed)
Noted  

## 2017-11-18 ENCOUNTER — Encounter: Payer: Self-pay | Admitting: Family Medicine

## 2017-11-18 DIAGNOSIS — Z5329 Procedure and treatment not carried out because of patient's decision for other reasons: Secondary | ICD-10-CM | POA: Insufficient documentation

## 2017-11-18 DIAGNOSIS — Z91199 Patient's noncompliance with other medical treatment and regimen due to unspecified reason: Secondary | ICD-10-CM | POA: Insufficient documentation

## 2017-11-18 NOTE — Progress Notes (Signed)
No show

## 2017-12-31 ENCOUNTER — Other Ambulatory Visit: Payer: Self-pay | Admitting: *Deleted

## 2017-12-31 NOTE — Telephone Encounter (Signed)
Rec'd fax for refill on Bupropion SR 150 mg. Per chart pt is no longer taking notes states d/c back in July 2018. Faxed paper refill back denied. Pt will need to make appt w/new provider for refills...Johny Chess

## 2018-01-15 ENCOUNTER — Other Ambulatory Visit: Payer: Self-pay | Admitting: Family

## 2018-02-16 ENCOUNTER — Telehealth: Payer: Self-pay | Admitting: Family

## 2018-02-16 ENCOUNTER — Telehealth: Payer: Self-pay | Admitting: *Deleted

## 2018-02-16 ENCOUNTER — Telehealth: Payer: Self-pay | Admitting: Family Medicine

## 2018-02-16 NOTE — Telephone Encounter (Signed)
Copied from [place] [id number]. Topic: Quick Communication - See Telephone Encounter >> Feb 16, 2018 11:46 AM Boyd Kerbs wrote: CRM for notification. See Telephone encounter for:   pt. Is going to [place] tomorrow at 3:15 and asking if office can put in a referral as they require it.   Please call pt.   02/16/18.

## 2018-02-16 NOTE — Telephone Encounter (Signed)
Copied from [place] [phone]. Topic: Referral - Request >> Feb 16, 2018 11:43 AM Boyd Kerbs wrote: Reason for CRM:   pt. Is going to [place] tomorrow at 3:15 and asking if office can put in a referral as they require it.   Please call pt.

## 2018-02-16 NOTE — Telephone Encounter (Signed)
Gareth Eagle spoke with pt and advised for referral from her GYN doctor since she has not established with new PCP or been seen for this issue.

## 2018-02-16 NOTE — Telephone Encounter (Signed)
Called patient to clarify. She said that she is scheduled for a diagnostic imaging mammogram due to a lump she has found on her right breast. Patient has not re-established care with our office. Per Lovena Le, we would not be able to order this without her being seen. I recommended that she check with GYN being that they follow her yearly mammo to see if they could order it for her. She expressed understanding.

## 2018-02-16 NOTE — Telephone Encounter (Signed)
Pt called c/o right breast lump requesting diag mammogram order faxed to [facility], I explained to patient she will need a OV with provider at office. Pt said it doesn't make sense and she will follow up with PCP. I did explain to patient we will need to see her before order can be placed.

## 2018-02-17 ENCOUNTER — Ambulatory Visit: Payer: BLUE CROSS/BLUE SHIELD | Admitting: Internal Medicine

## 2018-02-17 ENCOUNTER — Encounter: Payer: Self-pay | Admitting: Internal Medicine

## 2018-02-17 VITALS — BP 140/100 | HR 73 | Temp 98.1°F | Ht 67.0 in | Wt 144.0 lb

## 2018-02-17 DIAGNOSIS — N6310 Unspecified lump in the right breast, unspecified quadrant: Secondary | ICD-10-CM | POA: Diagnosis not present

## 2018-02-17 DIAGNOSIS — N6315 Unspecified lump in the right breast, overlapping quadrants: Secondary | ICD-10-CM | POA: Insufficient documentation

## 2018-02-17 LAB — HM MAMMOGRAPHY

## 2018-02-17 NOTE — Patient Instructions (Signed)
We have ordered the mammogram and the ultrasound (if needed) for the breast on the right side to look at that lump.

## 2018-02-17 NOTE — Progress Notes (Signed)
   Subjective:    Patient ID: Kristine Madden, female    DOB: 06/24/1966, 52 y.o.   MRN: [id number]  HPI The patient is a 52 YO female coming in for lump in her right breast. Last mammogram 04/2017 without findings. She did have dense breasts. She found it last week and it is hard and non-tender. She is concerned as she has had dense breasts in the past and had a cyst drained on the left. Denies weight change.   Review of Systems  Constitutional: Negative.   Respiratory: Negative for cough, chest tightness and shortness of breath.   Cardiovascular: Negative for chest pain, palpitations and leg swelling.  Gastrointestinal: Negative for abdominal distention, abdominal pain, constipation, diarrhea, nausea and vomiting.  Genitourinary:       Breast mass right side  Musculoskeletal: Negative.   Skin: Negative.   Neurological: Negative.       Objective:   Physical Exam  Constitutional: She is oriented to person, place, and time. She appears well-developed and well-nourished.  HENT:  Head: Normocephalic and atraumatic.  Eyes: EOM are normal.  Neck: Normal range of motion.  Cardiovascular: Normal rate and regular rhythm.  Pulmonary/Chest: Effort normal and breath sounds normal. No respiratory distress. She has no wheezes. She has no rales.  Bilateral breast exam done, appearance even without distortion, no nipple discharge, left breast normal, right breast with firm lesion 12 o clock position about 1 cm, no axillary LAD either side  Abdominal: Soft.  Musculoskeletal: She exhibits no edema.  Neurological: She is alert and oriented to person, place, and time. Coordination normal.  Skin: Skin is warm and dry.   Vitals:   02/17/18 0855  BP: (!) 140/100  Pulse: 73  Temp: 98.1 F (36.7 C)  TempSrc: Oral  SpO2: 99%  Weight: 144 lb (65.3 kg)  Height: 5\' 7"  (1.702 m)      Assessment & Plan:

## 2018-02-17 NOTE — Assessment & Plan Note (Signed)
Ordered diagnostic mammogram and [place] right breast. Lesion is firm and could represent ductal tissue or malignant origin. She is scheduled for today for these tests at [facility].

## 2018-02-18 ENCOUNTER — Encounter: Payer: Self-pay | Admitting: Family

## 2018-05-25 ENCOUNTER — Encounter: Payer: BLUE CROSS/BLUE SHIELD | Admitting: Nurse Practitioner

## 2018-05-25 DIAGNOSIS — Z0289 Encounter for other administrative examinations: Secondary | ICD-10-CM

## 2018-05-29 ENCOUNTER — Ambulatory Visit: Payer: BLUE CROSS/BLUE SHIELD | Admitting: Nurse Practitioner

## 2018-05-29 ENCOUNTER — Other Ambulatory Visit (INDEPENDENT_AMBULATORY_CARE_PROVIDER_SITE_OTHER): Payer: BLUE CROSS/BLUE SHIELD

## 2018-05-29 ENCOUNTER — Encounter: Payer: Self-pay | Admitting: Nurse Practitioner

## 2018-05-29 VITALS — BP 128/90 | HR 71 | Temp 98.1°F | Ht 67.0 in | Wt 140.0 lb

## 2018-05-29 DIAGNOSIS — Z23 Encounter for immunization: Secondary | ICD-10-CM

## 2018-05-29 DIAGNOSIS — B182 Chronic viral hepatitis C: Secondary | ICD-10-CM

## 2018-05-29 DIAGNOSIS — Z1211 Encounter for screening for malignant neoplasm of colon: Secondary | ICD-10-CM

## 2018-05-29 DIAGNOSIS — Z114 Encounter for screening for human immunodeficiency virus [HIV]: Secondary | ICD-10-CM | POA: Diagnosis not present

## 2018-05-29 DIAGNOSIS — Z0001 Encounter for general adult medical examination with abnormal findings: Secondary | ICD-10-CM

## 2018-05-29 DIAGNOSIS — Z13220 Encounter for screening for lipoid disorders: Secondary | ICD-10-CM

## 2018-05-29 DIAGNOSIS — Z1212 Encounter for screening for malignant neoplasm of rectum: Secondary | ICD-10-CM | POA: Diagnosis not present

## 2018-05-29 LAB — LIPID PANEL
CHOLESTEROL: 226 mg/dL — AB (ref 0–200)
HDL: 62.7 mg/dL (ref >39.00)
LDL Cholesterol: 150 mg/dL — ABNORMAL HIGH (ref 0–99)
NonHDL: 163.64
Total CHOL/HDL Ratio: 4
Triglycerides: 68 mg/dL (ref 0.0–149.0)
VLDL: 13.6 mg/dL (ref 0.0–40.0)

## 2018-05-29 LAB — COMPREHENSIVE METABOLIC PANEL
ALBUMIN: 4.6 g/dL (ref 3.5–5.2)
ALK PHOS: 73 U/L (ref 39–117)
ALT: 44 U/L — ABNORMAL HIGH (ref 0–35)
AST: 52 U/L — ABNORMAL HIGH (ref 0–37)
BILIRUBIN TOTAL: 0.5 mg/dL (ref 0.2–1.2)
BUN: 11 mg/dL (ref 6–23)
CALCIUM: 9.9 mg/dL (ref 8.4–10.5)
CO2: 29 mEq/L (ref 19–32)
Chloride: 101 mEq/L (ref 96–112)
Creatinine, Ser: 0.82 mg/dL (ref 0.40–1.20)
GFR: 77.32 mL/min (ref >60.00)
Glucose, Bld: 68 mg/dL — ABNORMAL LOW (ref 70–99)
POTASSIUM: 3.8 meq/L (ref 3.5–5.1)
Sodium: 141 mEq/L (ref 135–145)
TOTAL PROTEIN: 7.6 g/dL (ref 6.0–8.3)

## 2018-05-29 LAB — CBC
HEMATOCRIT: 47 % — AB (ref 36.0–46.0)
HEMOGLOBIN: 16.3 g/dL — AB (ref 12.0–15.0)
MCHC: 34.7 g/dL (ref 30.0–36.0)
MCV: 93.9 fl (ref 78.0–100.0)
PLATELETS: 265 10*3/uL (ref 150.0–400.0)
RBC: 5 Mil/uL (ref 3.87–5.11)
RDW: 14.2 % (ref 11.5–15.5)
WBC: 6.2 10*3/uL (ref 4.0–10.5)

## 2018-05-29 NOTE — Assessment & Plan Note (Addendum)
We discussed that there are new and more affordable treatments for Hep C and recommended referral to hepatitis C clinic for further evaluation and management of chronic hep C infection-she is agreeable Update labs today - Comprehensive metabolic panel; Future - CBC; Future - Hepatitis C antibody; Future - Hepatitis A antibody, total; Future - Hepatitis B surface antibody; Future - Ambulatory referral to Gastroenterology

## 2018-05-29 NOTE — Assessment & Plan Note (Addendum)
-  USPSTF grade A and B recommendations reviewed with patient; age-appropriate recommendations, preventive care, screening tests, etc discussed and encouraged; healthy living, sunscreen use and smoking cessation encouraged; see AVS for patient education given to patient. advanced directives packet given to patient -Discussed importance of 150 minutes of physical activity weekly,  eat 6 servings of fruit/vegetables daily and drink plenty of water and avoid sweet beverages.  - Follow up and care instructions discussed and provided in AVS.  -Reviewed Health Maintenance:  Colon cancer screening, Screening for malignant neoplasm of the rectum- Cologuard ordered Need for Tdap vaccination- Tdap vaccine greater than or equal to 7yo IM Screening for HIV (human immunodeficiency virus)- HIV antibody; Future  Screening for cholesterol level- did not ask if she was fasting, Lipid panel; Future

## 2018-05-29 NOTE — Progress Notes (Signed)
Name: Kristine Madden   MRN: [id number]    DOB: September 06, 1966   Date:05/29/2018       Progress Note  Subjective  Chief Complaint  CPE  HPI  Patient presents for annual CPE.  Diet, Exercise: yoga, workouts and weights several times a week, tries to eat a healthy diet  USPSTF grade A and B recommendations  Depression: Denies depression today Depression screen PHQ 2/9 05/27/2017  Decreased Interest 2  Down, Depressed, Hopeless 1  PHQ - 2 Score 3  Altered sleeping 0  Tired, decreased energy 1  Change in appetite 1  Feeling bad or failure about yourself  0  Trouble concentrating 2  Moving slowly or fidgety/restless 0  Suicidal thoughts 0  PHQ-9 Score 7  Difficult doing work/chores Somewhat difficult   Hypertension: BP Readings from Last 3 Encounters:  05/29/18 128/90  02/17/18 (!) 140/100  09/29/17 138/88   Obesity: Wt Readings from Last 3 Encounters:  05/29/18 140 lb (63.5 kg)  02/17/18 144 lb (65.3 kg)  09/29/17 134 lb 1.3 oz (60.8 kg)   BMI Readings from Last 3 Encounters:  05/29/18 21.93 kg/m  02/17/18 22.55 kg/m  09/29/17 21.00 kg/m    Alcohol: occasionally glass of wine Tobacco use: current smoker, thinking of quitting  HIV, hep C: HIV screening today, history of active hep C, declined hep C treatment years ago due to cost and has not been re-evaluated in over 20 years, she says she was told that she was not curable so never desired to follow up  STD testing and prevention (chl/gon/syphilis): no concerns, declines screening  Intimate partner violence: denies, feels safe  Vaccinations: up to date  Advanced Care Planning: A voluntary discussion about advance care planning including the explanation and discussion of advance directives.  Discussed health care proxy and Living will, and the patient DOES NOT have a living will at present time. If patient does have living will, I have requested they bring this to the clinic to be scanned in to their chart.  Breast  cancer: mammogram up to date [facility] Mammogram  Date Value Ref Range Status  02/17/2018 0-4 Bi-Rad 0-4 Bi-Rad, Self Reported Normal Final    Comment:    Catregory 1: negative    Cervical cancer screening: s/p hysterectomy in June, has continued following with GYN for surgical follow up and routine womens care   Lipids: lipid panel today No results found for: CHOL No results found for: HDL No results found for: LDLCALC No results found for: TRIG No results found for: CHOLHDL No results found for: LDLDIRECT  Glucose:  Glucose, Bld  Date Value Ref Range Status  07/06/2017 98 65 - 99 mg/dL Final  07/05/2017 80 65 - 99 mg/dL Final  07/04/2017 121 (H) 65 - 99 mg/dL Final    Skin cancer: routinely wears sunscreen  Colorectal cancer: cologuard ordered today  Aspirin: not indicated ECG: not indicated   Patient Active Problem List   Diagnosis Date Noted  . Breast lump on right side at 12 o'clock position 02/17/2018  . No-show for appointment 11/18/2017  . Acute non-recurrent frontal sinusitis 09/29/2017  . Endometrial adenocarcinoma ([place]) 06/29/2017  . Postoperative state 06/17/2017  . Encounter for general adult medical examination with abnormal findings 05/27/2017  . Depression 05/27/2017  . Tobacco use 05/27/2017  . Exposure to STD 09/04/2016  . Pleuritic chest pain 09/04/2016  . Hepatitis C 09/04/2016    Past Surgical History:  Procedure Laterality Date  . DILATATION & CURETTAGE/HYSTEROSCOPY WITH  MYOSURE N/A 01/03/2017   Procedure: [place];  Surgeon: [name], MD;  Location: [facility];  Service: Gynecology;  Laterality: N/A;  . EYE SURGERY    . HEMORRHOID SURGERY    . KNEE ARTHROSCOPY    . LAPAROSCOPY N/A 06/28/2017   Procedure: VAGINAL EXAM UNDER ANESTHESIA,LAPAROSCOPY DIAGNOSTIC, EXPLORATORY LAPAROTOMY WITH EVACUATION OF HEMATOMA;  Surgeon: [name], MD;  Location: [facility];  Service: Gynecology;  Laterality: N/A;  .  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 REPAIR VAGINAL CUFF N/A 06/27/2017   Procedure: REPAIR VAGINAL CUFF;  Surgeon: [name], MD;  Location: [facility];  Service: Gynecology;  Laterality: N/A;  . ROBOTIC ASSISTED TOTAL HYSTERECTOMY WITH UTEROSACRAL LIGAMENT SUSPENSION Bilateral 06/17/2017   Procedure: ROBOTIC ASSISTED TOTAL HYSTERECTOMY, Bilateral Salpingectomy,  UTEROSACRAL LIGAMENT SUSPENSION;  Surgeon: [name], MD;  Location: [facility];  Service: Gynecology;  Laterality: Bilateral;     RNFA confirmed on 05/12/17  Dr. Dellis Filbert assisting in [place] room 3  . SPLENECTOMY, TOTAL    . TONSILLECTOMY    . TUBAL LIGATION      Family History  Problem Relation Age of Onset  . Arthritis Mother   . Hyperlipidemia Mother   . Hypertension Father     Social History   Socioeconomic History  . Marital status: Married    Spouse name: Not on file  . Number of children: 1  . Years of education: 68  . Highest education level: Not on file  Occupational History  . Occupation: Nurse, children's  Social Needs  . Financial resource strain: Not on file  . Food insecurity:    Worry: Not on file    Inability: Not on file  . Transportation needs:    Medical: Not on file    Non-medical: Not on file  Tobacco Use  . Smoking status: Former Smoker    Packs/day: 0.50    Years: 30.00    Pack years: 15.00    Types: Cigarettes  . Smokeless tobacco: Never Used  Substance and Sexual Activity  . Alcohol use: Yes    Alcohol/week: 1.8 - 2.4 oz    Types: 3 - 4 Glasses of wine per week  . Drug use: No  . Sexual activity: Not on file  Lifestyle  . Physical activity:    Days per week: Not on file    Minutes per session: Not on file  . Stress: Not on file  Relationships  . Social connections:    Talks on phone: Not on file    Gets together: Not on file    Attends religious service: Not on file    Active member of club or organization: Not on file    Attends meetings of clubs or  organizations: Not on file    Relationship status: Not on file  . Intimate partner violence:    Fear of current or ex partner: Not on file    Emotionally abused: Not on file    Physically abused: Not on file    Forced sexual activity: Not on file  Other Topics Concern  . Not on file  Social History Narrative   Fun: Yoga, Armand[place] Gemma and dogs.    Denies abuse and feels safe at home.     No current outpatient medications on file.  Allergies  Allergen Reactions  . Hydrocodone Nausea And Vomiting  . Penicillins Hives    Has patient had a PCN reaction causing immediate rash, facial/tongue/throat swelling, SOB or  lightheadedness with hypotension: No Has patient had a PCN reaction causing severe rash involving mucus membranes or skin necrosis: No Has patient had a PCN reaction that required hospitalization No Has patient had a PCN reaction occurring within the last 10 years: No If all of the above answers are "NO", then may proceed with Cephalosporin use.   Marland Kitchen Percocet [Oxycodone-Acetaminophen] Itching and Nausea And Vomiting    Nausea and vomitting  . Other Rash    [place]     ROS  Constitutional: Negative for fever or weight change.  Respiratory: Negative for cough and shortness of breath.   Cardiovascular: Negative for chest pain or palpitations.  Gastrointestinal: Negative for abdominal pain, nausea, vomiting, no bowel changes.  Musculoskeletal: Negative for gait problem or joint swelling.  Skin: Negative for rash, pallor, jaundice. Neurological: Negative for dizziness or headache.  No other specific complaints in a complete review of systems (except as listed in HPI above).   Objective  Vitals:   05/29/18 1040  BP: 128/90  Pulse: 71  Temp: 98.1 F (36.7 C)  TempSrc: Oral  SpO2: 98%  Weight: 140 lb (63.5 kg)  Height: 5\' 7"  (1.702 m)    Body mass index is 21.93 kg/m.  Physical Exam Vitals signs reviewed. Constitutional: Patient appears well-developed and  well-nourished. No distress.  HENT: Head: Normocephalic and atraumatic. Ears: B TMs ok, no erythema or effusion; Nose: Nose normal. Mouth/Throat: Oropharynx is clear and moist. No oropharyngeal exudate.  Eyes: Conjunctivae and EOM are normal. Pupils are equal, round, and reactive to light. No scleral icterus.  Neck: Normal range of motion. Neck supple. No cervical adenopathy. No thyromegaly present.  Cardiovascular: Normal rate, regular rhythm and normal heart sounds.  No murmur heard. No BLE edema. Distal pulses intact Pulmonary/Chest: Effort normal and breath sounds normal. No respiratory distress. Abdominal: Soft. Bowel sounds are normal, no distension. There is no tenderness. no masses FEMALE GENITALIA:  Defd to GYN Musculoskeletal: Normal range of motion. No gross deformities Neurological: She is alert and oriented to person, place, and time. No cranial nerve deficit. Coordination, balance, strength, speech and gait are normal.  Skin: Skin is warm and dry. No rash noted. No erythema.  Psychiatric: Patient has a normal mood and affect. behavior is normal. Judgment and thought content normal.   Assessment & Plan RTC in 1 year for CPE, or sooner if needed

## 2018-05-29 NOTE — Patient Instructions (Addendum)
Please head downstairs for lab work/x-rays. If any of your test results are critically abnormal, you will be contacted right away. Your results may be released to your MyChart for viewing before I am able to provide you with my response. I will contact you within a week about your test results and any recommendations for abnormalities.  I have placed a referral to the hepatitis C clinic. Our office will call you to schedule this appointment. You should hear from our office in 7-10 days.  I will plan to see you back in 1 year, or sooner if you need me  It was good to meet you. Thanks for letting me take care of you today :)  Health Maintenance, Female Adopting a healthy lifestyle and getting preventive care can go a long way to promote health and wellness. Talk with your health care provider about what schedule of regular examinations is right for you. This is a good chance for you to check in with your provider about disease prevention and staying healthy. In between checkups, there are plenty of things you can do on your own. Experts have done a lot of research about which lifestyle changes and preventive measures are most likely to keep you healthy. Ask your health care provider for more information. Weight and diet Eat a healthy diet  Be sure to include plenty of vegetables, fruits, low-fat dairy products, and lean protein.  Do not eat a lot of foods high in solid fats, added sugars, or salt.  Get regular exercise. This is one of the most important things you can do for your health. ? Most adults should exercise for at least 150 minutes each week. The exercise should increase your heart rate and make you sweat (moderate-intensity exercise). ? Most adults should also do strengthening exercises at least twice a week. This is in addition to the moderate-intensity exercise.  Maintain a healthy weight  Body mass index (BMI) is a measurement that can be used to identify possible weight  problems. It estimates body fat based on height and weight. Your health care provider can help determine your BMI and help you achieve or maintain a healthy weight.  For females 94 years of age and older: ? A BMI below 18.5 is considered underweight. ? A BMI of 18.5 to 24.9 is normal. ? A BMI of 25 to 29.9 is considered overweight. ? A BMI of 30 and above is considered obese.  Watch levels of cholesterol and blood lipids  You should start having your blood tested for lipids and cholesterol at 54 years of age, then have this test every 5 years.  You may need to have your cholesterol levels checked more often if: ? Your lipid or cholesterol levels are high. ? You are older than 54 years of age. ? You are at high risk for heart disease.  Cancer screening Lung Cancer  Lung cancer screening is recommended for adults 47-84 years old who are at high risk for lung cancer because of a history of smoking.  A yearly low-dose CT scan of the lungs is recommended for people who: ? Currently smoke. ? Have quit within the past 15 years. ? Have at least a 30-pack-year history of smoking. A pack year is smoking an average of one pack of cigarettes a day for 1 year.  Yearly screening should continue until it has been 15 years since you quit.  Yearly screening should stop if you develop a health problem that would prevent you from  having lung cancer treatment.  Breast Cancer  Practice breast self-awareness. This means understanding how your breasts normally appear and feel.  It also means doing regular breast self-exams. Let your health care provider know about any changes, no matter how small.  If you are in your 20s or 30s, you should have a clinical breast exam (CBE) by a health care provider every 1-3 years as part of a regular health exam.  If you are 43 or older, have a CBE every year. Also consider having a breast X-ray (mammogram) every year.  If you have a family history of breast  cancer, talk to your health care provider about genetic screening.  If you are at high risk for breast cancer, talk to your health care provider about having an MRI and a mammogram every year.  Breast cancer gene (BRCA) assessment is recommended for women who have family members with BRCA-related cancers. BRCA-related cancers include: ? Breast. ? Ovarian. ? Tubal. ? Peritoneal cancers.  Results of the assessment will determine the need for genetic counseling and BRCA1 and BRCA2 testing.  Cervical Cancer Your health care provider may recommend that you be screened regularly for cancer of the pelvic organs (ovaries, uterus, and vagina). This screening involves a pelvic examination, including checking for microscopic changes to the surface of your cervix (Pap test). You may be encouraged to have this screening done every 3 years, beginning at age 72.  For women ages 65-65, health care providers may recommend pelvic exams and Pap testing every 3 years, or they may recommend the Pap and pelvic exam, combined with testing for human papilloma virus (HPV), every 5 years. Some types of HPV increase your risk of cervical cancer. Testing for HPV may also be done on women of any age with unclear Pap test results.  Other health care providers may not recommend any screening for nonpregnant women who are considered low risk for pelvic cancer and who do not have symptoms. Ask your health care provider if a screening pelvic exam is right for you.  If you have had past treatment for cervical cancer or a condition that could lead to cancer, you need Pap tests and screening for cancer for at least 20 years after your treatment. If Pap tests have been discontinued, your risk factors (such as having a new sexual partner) need to be reassessed to determine if screening should resume. Some women have medical problems that increase the chance of getting cervical cancer. In these cases, your health care provider may  recommend more frequent screening and Pap tests.  Colorectal Cancer  This type of cancer can be detected and often prevented.  Routine colorectal cancer screening usually begins at 54 years of age and continues through 54 years of age.  Your health care provider may recommend screening at an earlier age if you have risk factors for colon cancer.  Your health care provider may also recommend using home test kits to check for hidden blood in the stool.  A small camera at the end of a tube can be used to examine your colon directly (sigmoidoscopy or colonoscopy). This is done to check for the earliest forms of colorectal cancer.  Routine screening usually begins at age 67.  Direct examination of the colon should be repeated every 5-10 years through 54 years of age. However, you may need to be screened more often if early forms of precancerous polyps or small growths are found.  Skin Cancer  Check your skin from head  to toe regularly.  Tell your health care provider about any new moles or changes in moles, especially if there is a change in a mole's shape or color.  Also tell your health care provider if you have a mole that is larger than the size of a pencil eraser.  Always use sunscreen. Apply sunscreen liberally and repeatedly throughout the day.  Protect yourself by wearing long sleeves, pants, a wide-brimmed hat, and sunglasses whenever you are outside.  Heart disease, diabetes, and high blood pressure  High blood pressure causes heart disease and increases the risk of stroke. High blood pressure is more likely to develop in: ? People who have blood pressure in the high end of the normal range (130-139/85-89 mm Hg). ? People who are overweight or obese. ? People who are African American.  If you are 62-27 years of age, have your blood pressure checked every 3-5 years. If you are 75 years of age or older, have your blood pressure checked every year. You should have your blood  pressure measured twice-once when you are at a hospital or clinic, and once when you are not at a hospital or clinic. Record the average of the two measurements. To check your blood pressure when you are not at a hospital or clinic, you can use: ? An automated blood pressure machine at a pharmacy. ? A home blood pressure monitor.  If you are between 63 years and 76 years old, ask your health care provider if you should take aspirin to prevent strokes.  Have regular diabetes screenings. This involves taking a blood sample to check your fasting blood sugar level. ? If you are at a normal weight and have a low risk for diabetes, have this test once every three years after 54 years of age. ? If you are overweight and have a high risk for diabetes, consider being tested at a younger age or more often. Preventing infection Hepatitis B  If you have a higher risk for hepatitis B, you should be screened for this virus. You are considered at high risk for hepatitis B if: ? You were born in a country where hepatitis B is common. Ask your health care provider which countries are considered high risk. ? Your parents were born in a high-risk country, and you have not been immunized against hepatitis B (hepatitis B vaccine). ? You have HIV or AIDS. ? You use needles to inject street drugs. ? You live with someone who has hepatitis B. ? You have had sex with someone who has hepatitis B. ? You get hemodialysis treatment. ? You take certain medicines for conditions, including cancer, organ transplantation, and autoimmune conditions.  Hepatitis C  Blood testing is recommended for: ? Everyone born from [birth year] through 1967. ? Anyone with known risk factors for hepatitis C.  Sexually transmitted infections (STIs)  You should be screened for sexually transmitted infections (STIs) including gonorrhea and chlamydia if: ? You are sexually active and are younger than 54 years of age. ? You are older than 54 years  of age and your health care provider tells you that you are at risk for this type of infection. ? Your sexual activity has changed since you were last screened and you are at an increased risk for chlamydia or gonorrhea. Ask your health care provider if you are at risk.  If you do not have HIV, but are at risk, it may be recommended that you take a prescription medicine daily to prevent HIV  infection. This is called pre-exposure prophylaxis (PrEP). You are considered at risk if: ? You are sexually active and do not regularly use condoms or know the HIV status of your partner(s). ? You take drugs by injection. ? You are sexually active with a partner who has HIV.  Talk with your health care provider about whether you are at high risk of being infected with HIV. If you choose to begin PrEP, you should first be tested for HIV. You should then be tested every 3 months for as long as you are taking PrEP. Pregnancy  If you are premenopausal and you may become pregnant, ask your health care provider about preconception counseling.  If you may become pregnant, take 400 to 800 micrograms (mcg) of folic acid every day.  If you want to prevent pregnancy, talk to your health care provider about birth control (contraception). Osteoporosis and menopause  Osteoporosis is a disease in which the bones lose minerals and strength with aging. This can result in serious bone fractures. Your risk for osteoporosis can be identified using a bone density scan.  If you are 70 years of age or older, or if you are at risk for osteoporosis and fractures, ask your health care provider if you should be screened.  Ask your health care provider whether you should take a calcium or vitamin D supplement to lower your risk for osteoporosis.  Menopause may have certain physical symptoms and risks.  Hormone replacement therapy may reduce some of these symptoms and risks. Talk to your health care provider about whether hormone  replacement therapy is right for you. Follow these instructions at home:  Schedule regular health, dental, and eye exams.  Stay current with your immunizations.  Do not use any tobacco products including cigarettes, chewing tobacco, or electronic cigarettes.  If you are pregnant, do not drink alcohol.  If you are breastfeeding, limit how much and how often you drink alcohol.  Limit alcohol intake to no more than 1 drink per day for nonpregnant women. One drink equals 12 ounces of beer, 5 ounces of wine, or 1 ounces of hard liquor.  Do not use street drugs.  Do not share needles.  Ask your health care provider for help if you need support or information about quitting drugs.  Tell your health care provider if you often feel depressed.  Tell your health care provider if you have ever been abused or do not feel safe at home. This information is not intended to replace advice given to you by your health care provider. Make sure you discuss any questions you have with your health care provider. Document Released: 06/24/2011 Document Revised: 05/16/2016 Document Reviewed: 09/12/2015 Elsevier Interactive Patient Education  [employer].

## 2018-06-04 LAB — HEPATITIS C ANTIBODY
HEP C AB: REACTIVE — AB
SIGNAL TO CUT-OFF: 32.7 — AB (ref <1.00)

## 2018-06-04 LAB — HEPATITIS A ANTIBODY, TOTAL: HEPATITIS A AB,TOTAL: REACTIVE — AB

## 2018-06-04 LAB — HCV RNA,QUANTITATIVE REAL TIME PCR
HCV Quantitative Log: 5.05 Log IU/mL — ABNORMAL HIGH
HCV RNA, PCR, QN: 112000 IU/mL — ABNORMAL HIGH

## 2018-06-04 LAB — HIV ANTIBODY (ROUTINE TESTING W REFLEX): HIV 1&2 Ab, 4th Generation: NONREACTIVE

## 2018-06-04 LAB — HEPATITIS B SURFACE ANTIBODY,QUALITATIVE: Hep B S Ab: REACTIVE — AB

## 2018-06-23 ENCOUNTER — Telehealth: Payer: Self-pay | Admitting: Nurse Practitioner

## 2018-06-23 DIAGNOSIS — B182 Chronic viral hepatitis C: Secondary | ICD-10-CM

## 2018-06-23 NOTE — Telephone Encounter (Signed)
-----   Message from Lorrin Jackson, CMA sent at 06/22/2018 11:42 AM EDT ----- Please advise. I am unsure where to send this referral.   Thanks ----- Message ----- From: Cora Daniels D Sent: 06/22/2018  11:02 AM To: Lorrin Jackson, CMA  The referral for gastroenterology for hep c clinic needs to be changed.  A new referral needs to be entered but I can't remember what specialty. I think it may be hep C or liver specialist. If neither of those work, let me know and we'll figure it out together :)  Thanks Cecille Rubin

## 2018-06-23 NOTE — Telephone Encounter (Signed)
I have placed referral to hepatitis C clinic

## 2018-06-26 LAB — COLOGUARD: Cologuard: POSITIVE

## 2018-07-09 ENCOUNTER — Encounter: Payer: BLUE CROSS/BLUE SHIELD | Admitting: Family

## 2018-07-27 ENCOUNTER — Encounter: Payer: Self-pay | Admitting: Family

## 2018-07-27 ENCOUNTER — Encounter: Payer: Self-pay | Admitting: Nurse Practitioner

## 2018-07-27 ENCOUNTER — Ambulatory Visit (INDEPENDENT_AMBULATORY_CARE_PROVIDER_SITE_OTHER): Payer: BLUE CROSS/BLUE SHIELD | Admitting: Family

## 2018-07-27 ENCOUNTER — Telehealth: Payer: Self-pay

## 2018-07-27 VITALS — BP 159/98 | HR 73 | Ht 67.0 in | Wt 138.0 lb

## 2018-07-27 DIAGNOSIS — B182 Chronic viral hepatitis C: Secondary | ICD-10-CM | POA: Diagnosis not present

## 2018-07-27 DIAGNOSIS — R195 Other fecal abnormalities: Secondary | ICD-10-CM

## 2018-07-27 NOTE — Progress Notes (Signed)
Subjective:    Patient ID: Kristine Madden, female    DOB: 14-Apr-1966, 52 y.o.   MRN: [id number]  Chief Complaint  Patient presents with  . Hepatitis C    HPI:  Kristine Madden is a 52 y.o. female who presents today for an initial office visit for evaluation and treatment of Hepatitis C.  [name] was initially diagnosed with Hepatitis C when she was 52 years of age and believes she may have acquired it through IVDU. No blood tranfusions, tattoos or sharing of razors or toothbrushes. She has never been treated for Hepatitis C.   In September 2017 and found to have a viral load of 1 million. She was recently retested on 05/29/18 with a viral load of 112,000. Her risk factor for Hepatitis C. Her most recent CMP showed an AST of 52 and ALT of 44. She did a liver biopsy at 1 and decided to not treat at that time. She has been getting her liver functions tests to monitor and is now ready for treatment. She is Hepatitis B immune. She is currently asymptomatic and has no personal or family history of liver disease.    Allergies  Allergen Reactions  . Hydrocodone Nausea And Vomiting  . Penicillins Hives    Has patient had a PCN reaction causing immediate rash, facial/tongue/throat swelling, SOB or lightheadedness with hypotension: No Has patient had a PCN reaction causing severe rash involving mucus membranes or skin necrosis: No Has patient had a PCN reaction that required hospitalization No Has patient had a PCN reaction occurring within the last 10 years: No If all of the above answers are "NO", then may proceed with Cephalosporin use.   Marland Kitchen Percocet [Oxycodone-Acetaminophen] Itching and Nausea And Vomiting    Nausea and vomitting  . Other Rash    Mango      No outpatient medications prior to visit.   No facility-administered medications prior to visit.      Past Medical History:  Diagnosis Date  . Anxiety   . Arthritis   . Cancer [facility]) 2010   islet cell tumor  .  Depression   . Hepatitis C   . Hyperlipidemia   . Pericarditis 09/2016      Past Surgical History:  Procedure Laterality Date  . DILATATION & CURETTAGE/HYSTEROSCOPY WITH MYOSURE N/A 01/03/2017   Procedure: [place];  Surgeon: [name], MD;  Location: [facility];  Service: Gynecology;  Laterality: N/A;  . EYE SURGERY    . HEMORRHOID SURGERY    . KNEE ARTHROSCOPY    . LAPAROSCOPY N/A 06/28/2017   Procedure: VAGINAL EXAM UNDER ANESTHESIA,LAPAROSCOPY DIAGNOSTIC, EXPLORATORY LAPAROTOMY WITH EVACUATION OF HEMATOMA;  Surgeon: [name], MD;  Location: [facility];  Service: Gynecology;  Laterality: N/A;  . 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 REPAIR VAGINAL CUFF N/A 06/27/2017   Procedure: REPAIR VAGINAL CUFF;  Surgeon: [name], MD;  Location: [facility];  Service: Gynecology;  Laterality: N/A;  . ROBOTIC ASSISTED TOTAL HYSTERECTOMY WITH UTEROSACRAL LIGAMENT SUSPENSION Bilateral 06/17/2017   Procedure: ROBOTIC ASSISTED TOTAL HYSTERECTOMY, Bilateral Salpingectomy,  UTEROSACRAL LIGAMENT SUSPENSION;  Surgeon: [name], MD;  Location: [facility];  Service: Gynecology;  Laterality: Bilateral;     RNFA confirmed on 05/12/17  Dr. Dellis Filbert assisting in [place] room 3  . SPLENECTOMY, TOTAL    . TONSILLECTOMY    . TUBAL LIGATION        Family History  Problem Relation Age of Onset  .  Arthritis Mother   . Hyperlipidemia Mother   . Hypertension Father       Social History   Socioeconomic History  . Marital status: Married    Spouse name: Not on file  . Number of children: 1  . Years of education: 30  . Highest education level: Not on file  Occupational History  . Occupation: Nurse, children's  Social Needs  . Financial resource strain: Not on file  . Food insecurity:    Worry: Not on file    Inability: Not on file  . Transportation needs:    Medical: Not on file    Non-medical: Not on file  Tobacco Use  . Smoking  status: Current Every Day Smoker    Packs/day: 0.50    Years: 30.00    Pack years: 15.00    Types: Cigarettes  . Smokeless tobacco: Never Used  Substance and Sexual Activity  . Alcohol use: Yes    Alcohol/week: 1.8 - 2.4 oz    Types: 3 - 4 Glasses of wine per week  . Drug use: Yes    Types: LSD, Cocaine  . Sexual activity: Yes    Birth control/protection: Post-menopausal  Lifestyle  . Physical activity:    Days per week: Not on file    Minutes per session: Not on file  . Stress: Not on file  Relationships  . Social connections:    Talks on phone: Not on file    Gets together: Not on file    Attends religious service: Not on file    Active member of club or organization: Not on file    Attends meetings of clubs or organizations: Not on file    Relationship status: Not on file  . Intimate partner violence:    Fear of current or ex partner: Not on file    Emotionally abused: Not on file    Physically abused: Not on file    Forced sexual activity: Not on file  Other Topics Concern  . Not on file  Social History Narrative   Fun: Yoga, Armandina Gemma and dogs.    Denies abuse and feels safe at home.       Review of Systems  Constitutional: Negative for chills, fatigue, fever and unexpected weight change.  Respiratory: Negative for cough, chest tightness, shortness of breath and wheezing.   Cardiovascular: Negative for chest pain and leg swelling.  Gastrointestinal: Negative for abdominal distention, constipation, diarrhea, nausea and vomiting.  Neurological: Negative for dizziness, weakness, light-headedness and headaches.  Hematological: Does not bruise/bleed easily.       Objective:    BP (!) 159/98 (BP Location: Left Arm, Patient Position: Sitting, Cuff Size: Normal)   Pulse 73   Ht 5\' 7"  (1.702 m)   Wt 138 lb (62.6 kg)   BMI 21.61 kg/m  Nursing note and vital signs reviewed.  Physical Exam  Constitutional: She is oriented to person, place, and time. She appears  well-developed. No distress.  Cardiovascular: Normal rate, regular rhythm, normal heart sounds and intact distal pulses. Exam reveals no gallop and no friction rub.  No murmur heard. Pulmonary/Chest: Effort normal and breath sounds normal. No respiratory distress. She has no wheezes. She has no rales. She exhibits no tenderness.  Abdominal: Soft. Bowel sounds are normal. She exhibits no distension, no ascites and no mass. There is no hepatosplenomegaly. There is no tenderness. There is no rebound and no guarding.  Neurological: She is alert and oriented to person, place, and time.  Skin: Skin is warm and dry.  Psychiatric: She has a normal mood and affect. Her behavior is normal. Judgment and thought content normal.        Assessment & Plan:   Problem List Items Addressed This Visit      Digestive   Chronic hepatitis C without hepatic coma ([place]) - Primary    Ms. Festa has Chronic Hepatitis C with a viral load of 112,000 and has been infected since the age of 40 and likely from IVDU. She is currently asymptomatic. I will check her Genotype today and order an elastography. We discussed methods of transmission, signs/symptoms, and treatment options. Will likely plan to treat with Epclusa pending Genotype results.       Relevant Orders   Hepatitis C genotype   US ABDOMEN COMPLETE [facility] Done. [facility] does not currently have medications on file.   Follow-up: Pending lab and imaging results.    Terri Piedra, MSN, FNP-C Nurse Practitioner [facility] Office phone: [phone] Pager: [place] number: [phone]

## 2018-07-27 NOTE — Assessment & Plan Note (Signed)
Kristine Madden has Chronic Hepatitis C with a viral load of 112,000 and has been infected since the age of 63 and likely from IVDU. She is currently asymptomatic. I will check her Genotype today and order an elastography. We discussed methods of transmission, signs/symptoms, and treatment options. Will likely plan to treat with Epclusa pending Genotype results.

## 2018-07-27 NOTE — Patient Instructions (Signed)
Nice to see you!  We will check your Genotype today and schedule you for an ultrasound.   Once we have that information we will be in touch with the results and work to get you started on medication.

## 2018-07-27 NOTE — Telephone Encounter (Signed)
Spoke with pt to let her know of positive cologuard results. Pt is aware that she is going to a colonoscopy. Referral has been put in.

## 2018-07-30 LAB — HEPATITIS C GENOTYPE

## 2018-08-03 ENCOUNTER — Ambulatory Visit (HOSPITAL_COMMUNITY)
Admission: RE | Admit: 2018-08-03 | Discharge: 2018-08-03 | Disposition: A | Discharge status: Home / Self Care | Payer: BLUE CROSS/BLUE SHIELD | Source: Ambulatory Visit | Attending: Family | Admitting: Family

## 2018-08-03 ENCOUNTER — Encounter (HOSPITAL_COMMUNITY): Payer: Self-pay

## 2018-08-03 DIAGNOSIS — B182 Chronic viral hepatitis C: Secondary | ICD-10-CM

## 2018-08-04 ENCOUNTER — Ambulatory Visit (HOSPITAL_COMMUNITY)
Admission: RE | Admit: 2018-08-04 | Discharge: 2018-08-04 | Disposition: A | Discharge status: Home / Self Care | Payer: BLUE CROSS/BLUE SHIELD | Source: Ambulatory Visit | Attending: Family | Admitting: Family

## 2018-08-04 DIAGNOSIS — B182 Chronic viral hepatitis C: Secondary | ICD-10-CM | POA: Insufficient documentation

## 2018-08-04 DIAGNOSIS — Z9081 Acquired absence of spleen: Secondary | ICD-10-CM | POA: Diagnosis not present

## 2018-08-07 ENCOUNTER — Other Ambulatory Visit: Payer: Self-pay | Admitting: Family

## 2018-08-07 DIAGNOSIS — B182 Chronic viral hepatitis C: Secondary | ICD-10-CM

## 2018-08-07 MED ORDER — SOFOSBUVIR-VELPATASVIR 400-100 MG PO TABS: 1.0000 | ORAL_TABLET | Freq: Every day | ORAL | 2 refills | Status: DC

## 2018-08-12 ENCOUNTER — Other Ambulatory Visit: Payer: Self-pay | Admitting: Pharmacist Clinician (PhC)/ Clinical Pharmacy Specialist

## 2018-08-12 DIAGNOSIS — B182 Chronic viral hepatitis C: Secondary | ICD-10-CM

## 2018-08-12 MED ORDER — SOFOSBUVIR-VELPATASVIR 400-100 MG PO TABS: 1.0000 | ORAL_TABLET | Freq: Every day | ORAL | 2 refills | Status: DC

## 2018-08-12 NOTE — Progress Notes (Signed)
Will try [place].

## 2018-08-12 NOTE — Progress Notes (Signed)
Have to be tx to [facility].

## 2018-08-26 ENCOUNTER — Encounter: Payer: Self-pay | Admitting: Pharmacy Technician

## 2018-09-14 ENCOUNTER — Encounter: Payer: Self-pay | Admitting: Gastroenterology

## 2018-09-16 IMAGING — CT CT ABD-PELV W/ CM
1 of 4 series · 13 of 32 positions shown, 17 images · IV contrast (iopamidol)
Comparison: 06/30/2017, 07/04/2016, 06/01/2009

CLINICAL DATA: Status post hysterectomy now with abdominal
distension and elevated white cell count

EXAM:
CT ABDOMEN AND PELVIS WITH CONTRAST
TECHNIQUE: Multidetector CT imaging of the abdomen and pelvis was performed
using the standard protocol following bolus administration of
intravenous contrast.
CONTRAST:  100mL [id number] IOPAMIDOL (HQD64U-LWW) INJECTION 61%

[Series 2: routine abdomen/pelvis with · axial · 0.67mm/px · z∈[+684,+1109]mm · 13 of 95 slices shown, 17 images]
[im 5/95  soft-tissue]
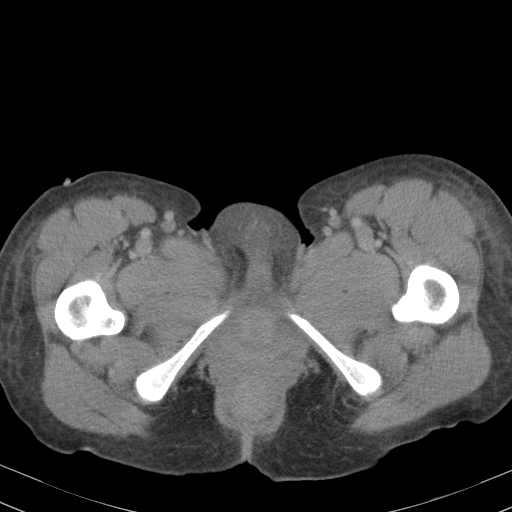
[im 5/95  bone]
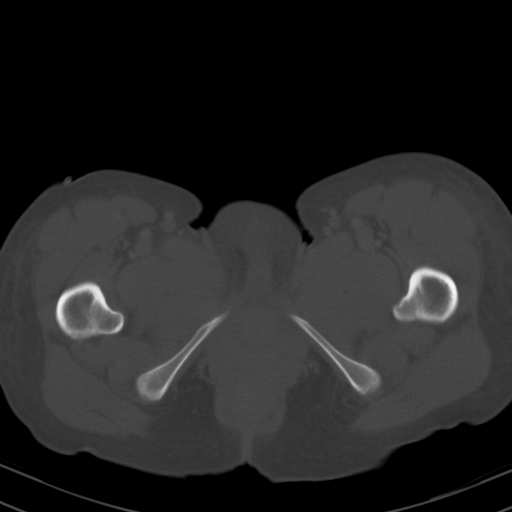
[im 15/95  soft-tissue]
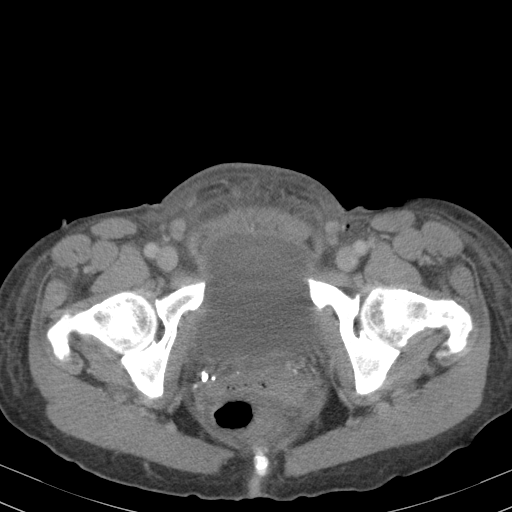
[im 25/95  soft-tissue]
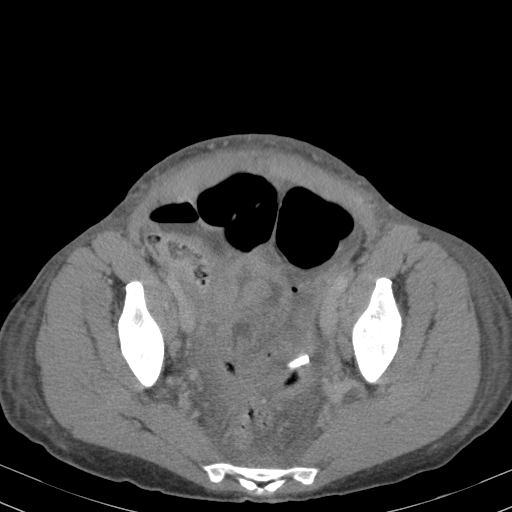
[im 30/95  soft-tissue]
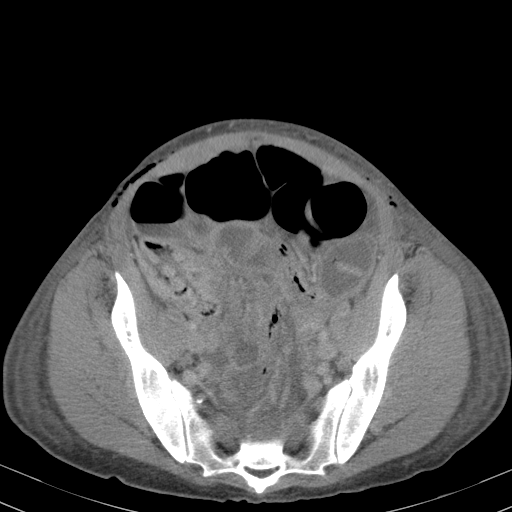
[im 40/95  soft-tissue]
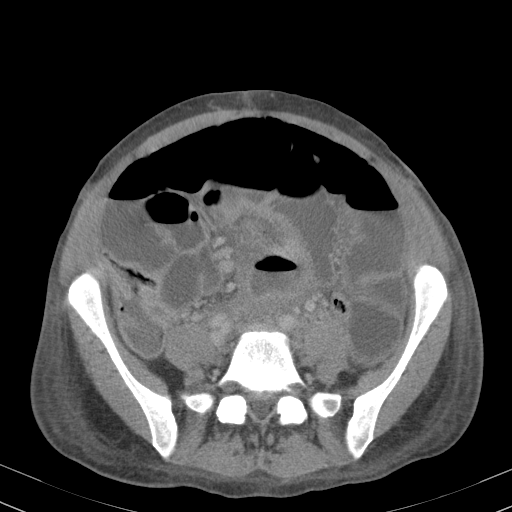
[im 50/95  soft-tissue]
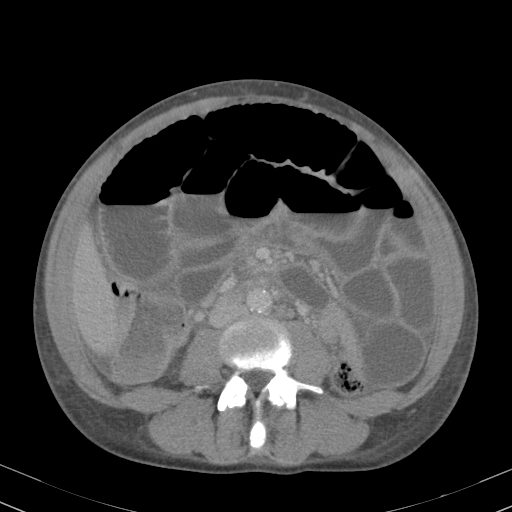
[im 55/95  soft-tissue]
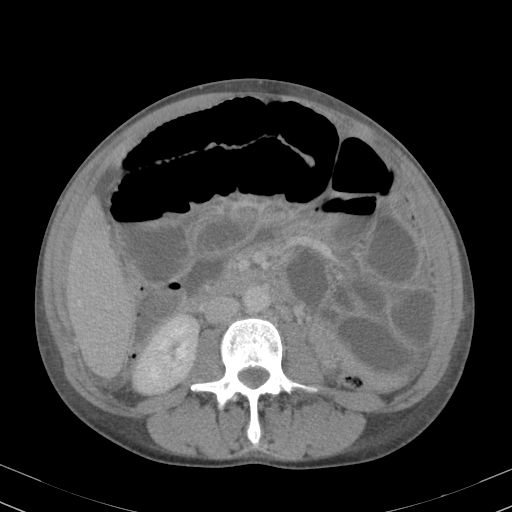
[im 65/95  soft-tissue]
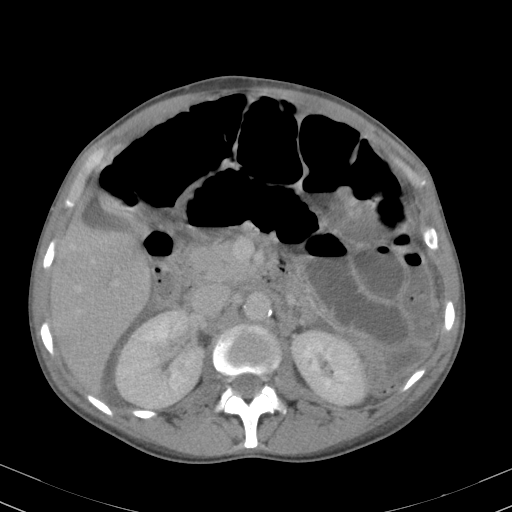
[im 70/95  soft-tissue]
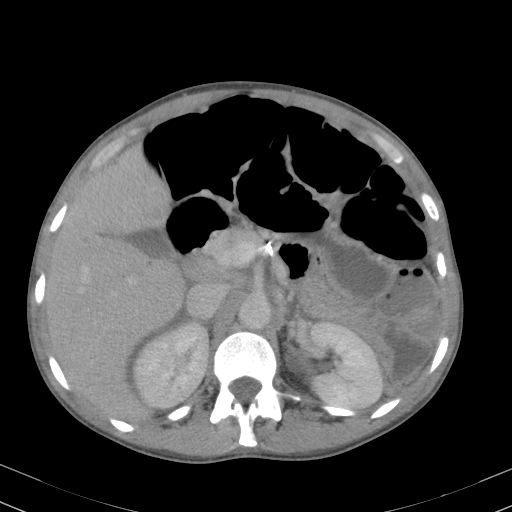
[im 70/95  bone]
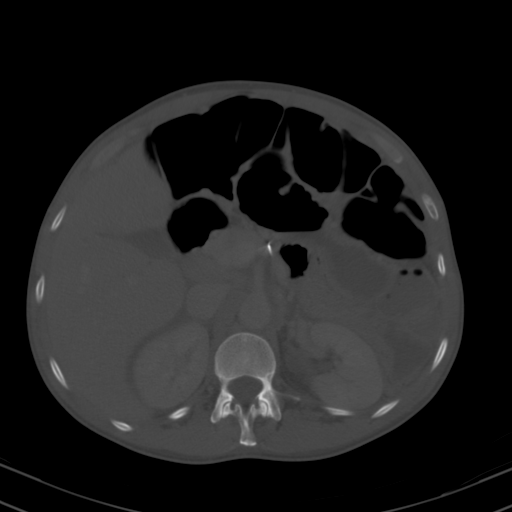
[im 75/95  lung]
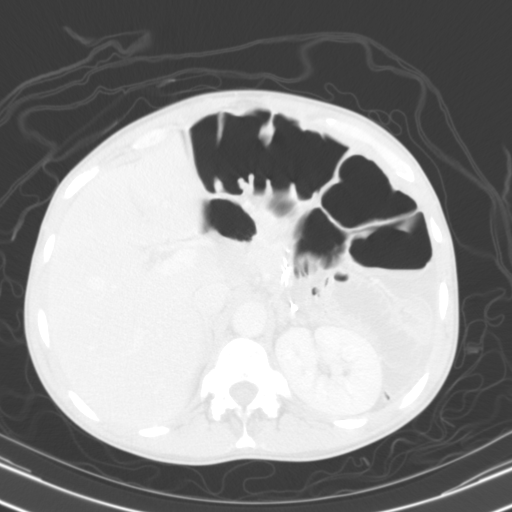
[im 80/95  soft-tissue]
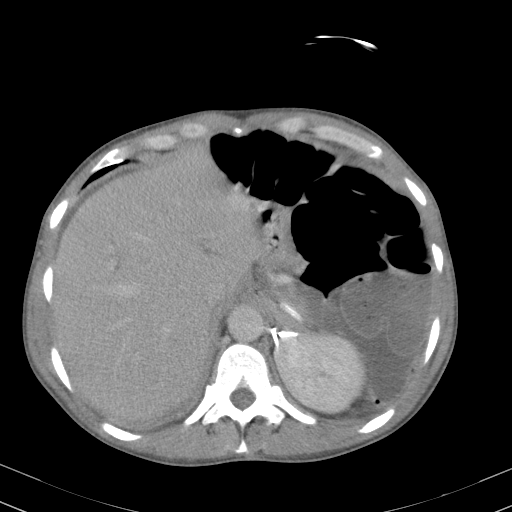
[im 80/95  lung]
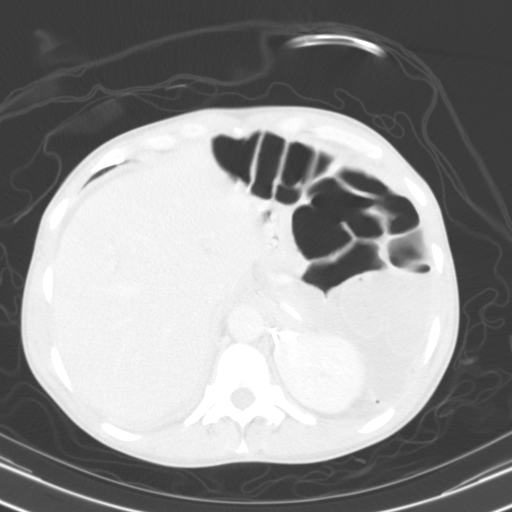
[im 85/95  lung]
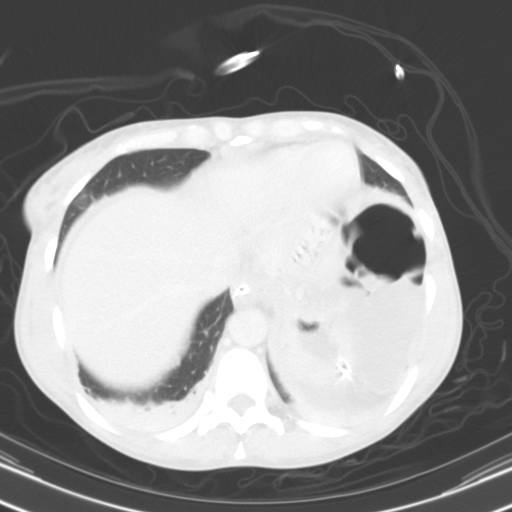
[im 90/95  soft-tissue]
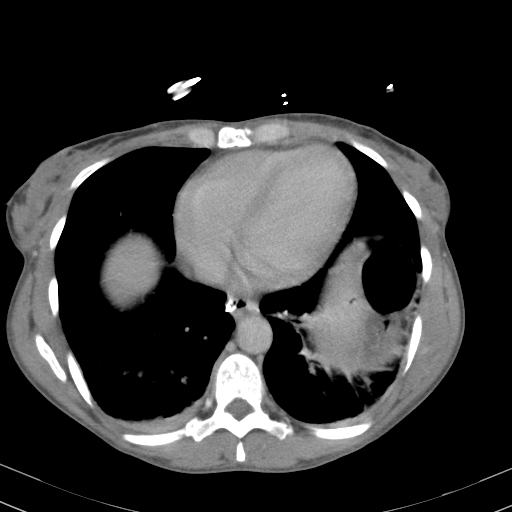
[im 90/95  lung]
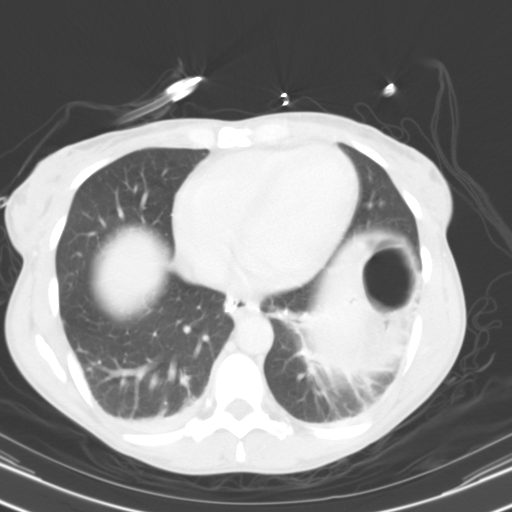

[13 of 32 positions shown; findings below may reference images not displayed]

FINDINGS: Lower chest: Small bilateral pleural effusions, right greater than
left with partial consolidation in the bases, likely due to
atelectasis. The heart is nonenlarged.

Hepatobiliary: No focal liver abnormality is seen. No gallstones,
gallbladder wall thickening, or biliary dilatation.

Pancreas: History of partial pancreatectomy. No inflammatory changes
are present. No ductal dilatation.

Spleen: Status post splenectomy

Adrenals/Urinary Tract: Adrenal glands are within normal limits.
Stable cyst in the mid left kidney. No hydronephrosis. Bladder
distended.

Stomach/Bowel: Esophageal tube present in the proximal stomach.
Multiple loops of fluid-filled dilated small bowel measuring up to
4.6 cm, concerning for small bowel obstruction. Difficult to
delineate transition point due to lack of enteral contrast, suspect
that the transition point is in the central pelvis were there appear
to be thickened distal small bowel loops. Appendix is not identified
with confidence. No colon wall thickening.

Vascular/Lymphatic: Aortic atherosclerosis. Mild retroperitoneal
adenopathy.

Reproductive: Status post hysterectomy.  No adnexal masses.

Other: Small pockets of soft tissue gas within the subcutaneous soft
tissues of the pelvis, likely due to recent postoperative status.
Drainage tube present within the central pelvis. Gas and fluid fluid
collections surrounding the drainage tube may reflect residual post-
operative collection or possibly an abscess. This measures
approximately 1.7 x 4.8 cm. There is a suspected slightly rim
enhancing fluid and gas collection within the upper pelvis measuring
6.4 cm craniocaudad by 3.6 cm transverse by 3.3 cm AP which may
reflect an abscess. Mild peritoneal enhancement around fluid
adjacent to the liver.

Musculoskeletal: Degenerative changes at L5-S1. No acute or
suspicious bone lesion
IMPRESSION: 1. Multiple fluid-filled loops of dilated small bowel concerning for
mechanical small bowel obstruction. Transition zone not well
delineated for reasons discussed above, obstruction probably related
to thickened decompressed distal small bowel in the mid to low
pelvis.
2. Drainage catheter in the central pelvis with associated gas and
fluid collection possible post- operative collection versus abscess.
Additional mildly rim enhancing gas and fluid collection within the
upper pelvis, suspect for abscess.
3. Multiple foci of soft tissue gas within the anterior abdominal
wall and pelvis, felt related to recent postsurgical status.
4. Small amount of fluid adjacent to the liver with suspected mild
peritoneal enhancement, raising possibility of peritonitis.
5. Small bilateral pleural effusions with adjacent atelectasis

## 2018-10-01 ENCOUNTER — Ambulatory Visit: Payer: BLUE CROSS/BLUE SHIELD

## 2018-10-08 ENCOUNTER — Ambulatory Visit (INDEPENDENT_AMBULATORY_CARE_PROVIDER_SITE_OTHER): Payer: BLUE CROSS/BLUE SHIELD | Admitting: Pharmacist

## 2018-10-08 ENCOUNTER — Ambulatory Visit: Payer: BLUE CROSS/BLUE SHIELD

## 2018-10-08 DIAGNOSIS — B182 Chronic viral hepatitis C: Secondary | ICD-10-CM | POA: Diagnosis not present

## 2018-10-08 DIAGNOSIS — Z23 Encounter for immunization: Secondary | ICD-10-CM

## 2018-10-08 NOTE — Progress Notes (Signed)
HPI: Kristine Madden is a 52 y.o. female who presents to [facility] for 1 month follow up after starting hepatitis C treatment.   Medication: Epclusa x 12 weeks   Start Date: 08/26/18  Hepatitis C Genotype: 1a  Fibrosis Score: F2/F3  Hepatitis C RNA: 112,000 (05/29/18)  Patient Active Problem List   Diagnosis Date Noted  . Breast lump on right side at 12 o'clock position 02/17/2018  . No-show for appointment 11/18/2017  . Acute non-recurrent frontal sinusitis 09/29/2017  . Endometrial adenocarcinoma ([place]) 06/29/2017  . Postoperative state 06/17/2017  . Encounter for general adult medical examination with abnormal findings 05/27/2017  . Depression 05/27/2017  . Tobacco use 05/27/2017  . Pleuritic chest pain 09/04/2016  . Chronic hepatitis C without hepatic coma ([place]) 09/04/2016    Patient's Medications  New Prescriptions   No medications on file  Previous Medications   SOFOSBUVIR-VELPATASVIR (EPCLUSA) 400-100 MG TABS    Take 1 tablet by mouth daily.  Modified Medications   No medications on file  Discontinued Medications   No medications on file    Allergies: Allergies  Allergen Reactions  . Hydrocodone Nausea And Vomiting  . Penicillins Hives    Has patient had a PCN reaction causing immediate rash, facial/tongue/throat swelling, SOB or lightheadedness with hypotension: No Has patient had a PCN reaction causing severe rash involving mucus membranes or skin necrosis: No Has patient had a PCN reaction that required hospitalization No Has patient had a PCN reaction occurring within the last 10 years: No If all of the above answers are "NO", then may proceed with Cephalosporin use.   Marland Kitchen Percocet [Oxycodone-Acetaminophen] Itching and Nausea And Vomiting    Nausea and vomitting  . Other Rash    Mango    Past Medical History: Past Medical History:  Diagnosis Date  . Anxiety   . Arthritis   . Cancer [facility]) 2010   islet cell tumor  . Depression   . Hepatitis C   .  Hyperlipidemia   . Pericarditis 09/2016    Social History: Social History   Socioeconomic History  . Marital status: Married    Spouse name: Not on file  . Number of children: 1  . Years of education: 57  . Highest education level: Not on file  Occupational History  . Occupation: Nurse, children's  Social Needs  . Financial resource strain: Not on file  . Food insecurity:    Worry: Not on file    Inability: Not on file  . Transportation needs:    Medical: Not on file    Non-medical: Not on file  Tobacco Use  . Smoking status: Current Every Day Smoker    Packs/day: 0.50    Years: 30.00    Pack years: 15.00    Types: Cigarettes  . Smokeless tobacco: Never Used  Substance and Sexual Activity  . Alcohol use: Yes    Alcohol/week: 3.0 - 4.0 standard drinks    Types: 3 - 4 Glasses of wine per week  . Drug use: Yes    Types: LSD, Cocaine  . Sexual activity: Yes    Birth control/protection: Post-menopausal  Lifestyle  . Physical activity:    Days per week: Not on file    Minutes per session: Not on file  . Stress: Not on file  Relationships  . Social connections:    Talks on phone: Not on file    Gets together: Not on file    Attends religious service: Not on  file    Active member of club or organization: Not on file    Attends meetings of clubs or organizations: Not on file    Relationship status: Not on file  Other Topics Concern  . Not on file  Social History Narrative   Fun: Yoga, Armandina Gemma and dogs.    Denies abuse and feels safe at home.     Labs: Hepatitis C Lab Results  Component Value Date   HCVGENOTYPE 1a 07/27/2018   HEPCAB REACTIVE (A) 05/29/2018   HCVRNAPCRQN 112,000 (H) 05/29/2018   Hepatitis B Lab Results  Component Value Date   HEPBSAB REACTIVE (A) 05/29/2018   Hepatitis A Lab Results  Component Value Date   HAV REACTIVE (A) 05/29/2018   HIV Lab Results  Component Value Date   HIV NON-REACTIVE 05/29/2018   HIV Non Reactive  07/05/2017   Lab Results  Component Value Date   CREATININE 0.82 05/29/2018   CREATININE 0.48 07/06/2017   CREATININE 0.42 (L) 07/05/2017   CREATININE 0.47 07/04/2017   CREATININE 0.47 07/03/2017   Lab Results  Component Value Date   AST 52 (H) 05/29/2018   AST 18 07/06/2017   AST 17 07/05/2017   ALT 44 (H) 05/29/2018   ALT 15 07/06/2017   ALT 13 (L) 07/05/2017   INR 1.09 06/28/2017    Assessment: Tais has been taking Epclusa daily for over a month. She reports headaches that were the worst 2-3 weeks into treatment. She is experiencing some fatigue. Patient reports symptoms are tolerable and she will continue treatment. She reports missing two doses. Counseled patient on the importance of daily adherence and advised her to take the forgotten dose as soon as she remembers unless it is closer to her next dose, then skip and resume normal schedule. She uses an alarm on her phone to remember doses. Patient reports taking no other medications. Instructed patient to call clinic if she wishes to start a new drug during course of therapy. Given influenza vaccine in office. Follow up with Cassie in two months for end of therapy visit.    Plan: - Continue Epclusa daily  - CMET and HCV viral load today - Influenza vaccine today  - Follow up with Cassie on 11/23/18 @ 2:30PM   Isaias Sakai, Sherian Rein D PGY1 Pharmacy Resident  10/08/2018      2:39 PM

## 2018-10-08 NOTE — Patient Instructions (Signed)
It was great to see you today!   You received your flu shot in office.   Continue taking your Epclusa daily and try not to miss any doses.   We will take labs today.

## 2018-10-12 ENCOUNTER — Ambulatory Visit (AMBULATORY_SURGERY_CENTER): Payer: Self-pay | Admitting: *Deleted

## 2018-10-12 ENCOUNTER — Encounter: Payer: Self-pay | Admitting: Gastroenterology

## 2018-10-12 VITALS — Ht 67.0 in | Wt 141.0 lb

## 2018-10-12 DIAGNOSIS — R195 Other fecal abnormalities: Secondary | ICD-10-CM

## 2018-10-12 MED ORDER — NA SULFATE-K SULFATE-MG SULF 17.5-3.13-1.6 GM/177ML PO SOLN: ORAL | 0 refills | Status: DC

## 2018-10-12 NOTE — Progress Notes (Signed)
Patient denies any allergies to eggs or soy. Patient denies any problems with anesthesia/sedation. Patient denies any oxygen use at home. Patient denies taking any diet/weight loss medications or blood thinners. EMMI education assisgned to patient on colonoscopy, this was explained and instructions given to patient. Patient is allergic to mango, so I used Suprep not plenvu for prep. Suprep coupon $50 given to pt.

## 2018-10-13 LAB — COMPREHENSIVE METABOLIC PANEL
AG Ratio: 1.8 (calc) (ref 1.0–2.5)
ALKALINE PHOSPHATASE (APISO): 70 U/L (ref 33–130)
ALT: 13 U/L (ref 6–29)
AST: 20 U/L (ref 10–35)
Albumin: 4.6 g/dL (ref 3.6–5.1)
BILIRUBIN TOTAL: 0.3 mg/dL (ref 0.2–1.2)
BUN: 11 mg/dL (ref 7–25)
CALCIUM: 9.7 mg/dL (ref 8.6–10.4)
CO2: 30 mmol/L (ref 20–32)
Chloride: 102 mmol/L (ref 98–110)
Creat: 0.69 mg/dL (ref 0.50–1.05)
Globulin: 2.5 g/dL (calc) (ref 1.9–3.7)
Glucose, Bld: 91 mg/dL (ref 65–99)
Potassium: 4.2 mmol/L (ref 3.5–5.3)
Sodium: 139 mmol/L (ref 135–146)
Total Protein: 7.1 g/dL (ref 6.1–8.1)

## 2018-10-13 LAB — HEPATITIS C RNA QUANTITATIVE
HCV Quantitative Log: <1.18 Log IU/mL
HCV RNA, PCR, QN: NOT DETECTED [IU]/mL

## 2018-10-26 ENCOUNTER — Telehealth: Payer: Self-pay | Admitting: Gastroenterology

## 2018-10-26 ENCOUNTER — Encounter: Payer: BLUE CROSS/BLUE SHIELD | Admitting: Gastroenterology

## 2018-10-26 NOTE — Telephone Encounter (Signed)
Pt cancelled procedure.  She drank coffee with creamer yesterday and did not take her prep because of this.  She will call back to reschedule.

## 2018-11-03 ENCOUNTER — Ambulatory Visit (AMBULATORY_SURGERY_CENTER): Payer: BLUE CROSS/BLUE SHIELD | Admitting: Gastroenterology

## 2018-11-03 ENCOUNTER — Telehealth: Payer: Self-pay | Admitting: Gastroenterology

## 2018-11-03 ENCOUNTER — Encounter: Payer: Self-pay | Admitting: Gastroenterology

## 2018-11-03 VITALS — BP 130/82 | HR 57 | Temp 97.8°F | Resp 14 | Ht 67.0 in | Wt 141.0 lb

## 2018-11-03 DIAGNOSIS — D125 Benign neoplasm of sigmoid colon: Secondary | ICD-10-CM

## 2018-11-03 DIAGNOSIS — D129 Benign neoplasm of anus and anal canal: Secondary | ICD-10-CM

## 2018-11-03 DIAGNOSIS — D128 Benign neoplasm of rectum: Secondary | ICD-10-CM

## 2018-11-03 DIAGNOSIS — D123 Benign neoplasm of transverse colon: Secondary | ICD-10-CM

## 2018-11-03 DIAGNOSIS — R195 Other fecal abnormalities: Secondary | ICD-10-CM

## 2018-11-03 MED ORDER — SODIUM CHLORIDE 0.9 % IV SOLN: 500.0000 mL | Freq: Once | INTRAVENOUS | Status: DC

## 2018-11-03 NOTE — Progress Notes (Signed)
Pt's states no medical or surgical changes since previsit or office visit. 

## 2018-11-03 NOTE — Progress Notes (Signed)
Report given to PACU, vss 

## 2018-11-03 NOTE — Telephone Encounter (Signed)
Please send a referral to colo-rectal surgery for anal lesion found on colonoscopy.

## 2018-11-03 NOTE — Patient Instructions (Addendum)
Handouts Provided:  Polyps  Clip Card provided to you.  Please keep card with your medical records.   YOU HAD AN ENDOSCOPIC PROCEDURE TODAY AT [facility]:   Refer to the procedure report that was given to you for any specific questions about what was found during the examination.  If the procedure report does not answer your questions, please call your gastroenterologist to clarify.  If you requested that your care partner not be given the details of your procedure findings, then the procedure report has been included in a sealed envelope for you to review at your convenience later.  YOU SHOULD EXPECT: Some feelings of bloating in the abdomen. Passage of more gas than usual.  Walking can help get rid of the air that was put into your GI tract during the procedure and reduce the bloating. If you had a lower endoscopy (such as a colonoscopy or flexible sigmoidoscopy) you may notice spotting of blood in your stool or on the toilet paper. If you underwent a bowel prep for your procedure, you may not have a normal bowel movement for a few days.  Please Note:  You might notice some irritation and congestion in your nose or some drainage.  This is from the oxygen used during your procedure.  There is no need for concern and it should clear up in a day or so.  SYMPTOMS TO REPORT IMMEDIATELY:   Following lower endoscopy (colonoscopy or flexible sigmoidoscopy):  Excessive amounts of blood in the stool  Significant tenderness or worsening of abdominal pains  Swelling of the abdomen that is new, acute  Fever of 100F or higher  For urgent or emergent issues, a gastroenterologist can be reached at any hour by calling [phone].   DIET:  We do recommend a small meal at first, but then you may proceed to your regular diet.  Drink plenty of fluids but you should avoid alcoholic beverages for 24 hours.  ACTIVITY:  You should plan to take it easy for the rest of today and you should NOT  DRIVE or use heavy machinery until tomorrow (because of the sedation medicines used during the test).    FOLLOW UP: Our staff will call the number listed on your records the next business day following your procedure to check on you and address any questions or concerns that you may have regarding the information given to you following your procedure. If we do not reach you, we will leave a message.  However, if you are feeling well and you are not experiencing any problems, there is no need to return our call.  We will assume that you have returned to your regular daily activities without incident.  If any biopsies were taken you will be contacted by phone or by letter within the next 1-3 weeks.  Please call us at [phone] if you have not heard about the biopsies in 3 weeks.    SIGNATURES/CONFIDENTIALITY: You and/or your care partner have signed paperwork which will be entered into your electronic medical record.  These signatures attest to the fact that that the information above on your After Visit Summary has been reviewed and is understood.  Full responsibility of the confidentiality of this discharge information lies with you and/or your care-partner.

## 2018-11-03 NOTE — Progress Notes (Signed)
Called to room to assist during endoscopic procedure.  Patient ID and intended procedure confirmed with present staff. Received instructions for my participation in the procedure from the performing physician.  

## 2018-11-03 NOTE — Op Note (Signed)
[place] Patient Name: Kristine Madden Procedure Date: 11/03/2018 8:53 AM MRN: [id number] Endoscopist: Drew. Loletha Carrow , MD Age: 52 Referring MD:  Date of Birth: 11/27/1966 Gender: Female Account #: [id number] Procedure:                Colonoscopy Indications:              Positive Cologuard test Medicines:                Monitored Anesthesia Care Procedure:                Pre-Anesthesia Assessment:                           - Prior to the procedure, a History and Physical                            was performed, and patient medications and                            allergies were reviewed. The patient's tolerance of                            previous anesthesia was also reviewed. The risks                            and benefits of the procedure and the sedation                            options and risks were discussed with the patient.                            All questions were answered, and informed consent                            was obtained. Prior Anticoagulants: The patient has                            taken no previous anticoagulant or antiplatelet                            agents. ASA Grade Assessment: II - A patient with                            mild systemic disease. After reviewing the risks                            and benefits, the patient was deemed in                            satisfactory condition to undergo the procedure.                           After obtaining informed consent, the colonoscope  was passed under direct vision. Throughout the                            procedure, the patient's blood pressure, pulse, and                            oxygen saturations were monitored continuously. The                            Model PCF-H190DL [id number]) scope was introduced                            through the anus and advanced to the the cecum,                            identified by appendiceal orifice  and ileocecal                            valve. The colonoscopy was performed without                            difficulty. The patient tolerated the procedure                            well. The quality of the bowel preparation was                            good. The ileocecal valve, appendiceal orifice, and                            rectum were photographed. The quality of the bowel                            preparation was evaluated using the BBPS [facility]                            Bowel Preparation Scale) with scores of: Right                            Colon = 2, Transverse Colon = 2 and Left Colon = 2.                            The total BBPS score equals 6. Scope In: 8:58:32 AM Scope Out: 9:53:29 AM Scope Withdrawal Time: 0 hours 49 minutes 28 seconds  Total Procedure Duration: 0 hours 54 minutes 57 seconds  Findings:                 The digital rectal exam findings include a                            prominent, soft cushion of anal tissue. On  endoscopy, it had a somewhat irregular mucosal                            surface.                           Two sessile polyps were found in the proximal                            sigmoid colon and splenic flexure. The polyps were                            4 to 6 mm in size. These polyps were removed with a                            cold snare. Resection and retrieval were complete.                            (Jar 1)                           Two sessile polyps were found in the mid rectum.                            The polyps were 4 to 8 mm in size. These polyps                            were removed with a cold snare. Resection and                            retrieval were complete. (Jar 2)                           Two 4-6 mm polyps were found in the distal rectum.                            The polyps were sessile. The polyps were removed                            with a cold snare. Resection and  retrieval were                            complete. (Jar 5)                           A 10 mm polyp was found in the rectum. The polyp                            was multi-lobulated and semi-pedunculated. The                            polyp was removed with a hot snare. Resection and  retrieval were complete. (Jar 3)                           A 16-18 mm polyp was found in the rectum, on the                            middle valve of [place]. The polyp was                            semi-pedunculated. Area was successfully injected                            with 3 mL saline for a lift polypectomy. The polyp                            was removed with a hot snare. Resection and                            retrieval were complete. To prevent bleeding                            post-intervention, two hemostatic clips were                            successfully placed (MR conditional). Area was                            tattooed with an injection of 1 mL of Spot (two                            0.29ml injections on opposite walls distal to the                            polypectomy site. (Jar 4) Impression:               - Prominent cushion of anal tissue found on digital                            rectal exam.                           - Two 4 to 6 mm polyps in the proximal sigmoid                            colon and at the splenic flexure, removed with a                            cold snare. Resected and retrieved.                           - Two 4 to 8 mm polyps in the rectum, removed with  a cold snare. Resected and retrieved.                           - One 8 mm polyp in the rectum, removed with a hot                            snare. Resected and retrieved.                           - One 10 mm polyp in the rectum, removed with a hot                            snare. Resected and retrieved. Recommendation:           - Patient has a  contact number available for                            emergencies. The signs and symptoms of potential                            delayed complications were discussed with the                            patient. Return to normal activities tomorrow.                            Written discharge instructions were provided to the                            patient.                           - Resume previous diet.                           - Continue present medications.                           - Await pathology results.                           - Repeat colonoscopy is recommended for                            surveillance. The colonoscopy date will be                            determined after pathology results from today's                            exam become available for review.                           - Refer to a colo-rectal surgeon at appointment to  be scheduled to evaluate anal finding. Kristine Madden L. Loletha Carrow, MD 11/03/2018 10:19:58 AM This report has been signed electronically.

## 2018-11-04 ENCOUNTER — Telehealth: Payer: Self-pay | Admitting: *Deleted

## 2018-11-04 NOTE — Telephone Encounter (Signed)
Referral request faxed on 11-03-2018. Will await appointment information.

## 2018-11-04 NOTE — Telephone Encounter (Signed)
  Follow up Call-  Call back number 11/03/2018  Post procedure Call Back phone  # [phone]  Permission to leave phone message Yes  Some recent data might be hidden     Patient questions:  Do you have a fever, pain , or abdominal swelling? No. Pain Score  0 *  Have you tolerated food without any problems? Yes.    Have you been able to return to your normal activities? Yes.    Do you have any questions about your discharge instructions: Diet   No. Medications  No. Follow up visit  No.  Do you have questions or concerns about your Care? No.  Actions: * If pain score is 4 or above: No action needed, pain <4.

## 2018-11-10 ENCOUNTER — Encounter: Payer: Self-pay | Admitting: Gastroenterology

## 2018-11-16 NOTE — Telephone Encounter (Signed)
Left a message at [facility] for the referral status.

## 2018-11-16 NOTE — Telephone Encounter (Signed)
Scheduled for a consult with Dr. Dema Severin at [place] on 11-24-2018 @ 1045am

## 2018-11-23 ENCOUNTER — Telehealth: Payer: Self-pay | Admitting: Gastroenterology

## 2018-11-23 ENCOUNTER — Ambulatory Visit (INDEPENDENT_AMBULATORY_CARE_PROVIDER_SITE_OTHER): Payer: Self-pay | Admitting: Pharmacist

## 2018-11-23 DIAGNOSIS — B182 Chronic viral hepatitis C: Secondary | ICD-10-CM

## 2018-11-23 NOTE — Telephone Encounter (Signed)
Pt called in with questions about biopsy results.

## 2018-11-23 NOTE — Telephone Encounter (Signed)
Left message for patient to call back  

## 2018-11-23 NOTE — Telephone Encounter (Signed)
Patient had questions about pathology results and the reason for the surgical consult.  All questions answered.  She will keep the surgical consult for the anal tissue and understands she will need a follow up colonoscopy for the rectal polyp surveillance in 6 months

## 2018-11-23 NOTE — Progress Notes (Signed)
HPI: Kristine Madden is a 52 y.o. female who presents to the [place] clinic for follow-up of their Hepatitis C infection.  Medication: Epclusa x 12 weeks  Start Date: 08/26/18  Hepatitis C Genotype: 1a  Fibrosis Score: F2/F3  Hepatitis C RNA: 112,000 on 05/29/18, <15 on 10/08/18  Patient Active Problem List   Diagnosis Date Noted  . Breast lump on right side at 12 o'clock position 02/17/2018  . No-show for appointment 11/18/2017  . Acute non-recurrent frontal sinusitis 09/29/2017  . Endometrial adenocarcinoma ([place]) 06/29/2017  . Postoperative state 06/17/2017  . Encounter for general adult medical examination with abnormal findings 05/27/2017  . Depression 05/27/2017  . Tobacco use 05/27/2017  . Pleuritic chest pain 09/04/2016  . Chronic hepatitis C without hepatic coma ([place]) 09/04/2016    Patient's Medications  New Prescriptions   No medications on file  Previous Medications   IBUPROFEN (ADVIL,MOTRIN) 200 MG TABLET    Take 200 mg by mouth every 6 (six) hours as needed.  Modified Medications   No medications on file  Discontinued Medications   SOFOSBUVIR-VELPATASVIR (EPCLUSA) 400-100 MG TABS    Take 1 tablet by mouth daily.    Allergies: Allergies  Allergen Reactions  . Hydrocodone Nausea And Vomiting  . Penicillins Hives    Has patient had a PCN reaction causing immediate rash, facial/tongue/throat swelling, SOB or lightheadedness with hypotension: No Has patient had a PCN reaction causing severe rash involving mucus membranes or skin necrosis: No Has patient had a PCN reaction that required hospitalization No Has patient had a PCN reaction occurring within the last 10 years: No If all of the above answers are "NO", then may proceed with Cephalosporin use.   Marland Kitchen Percocet [Oxycodone-Acetaminophen] Itching and Nausea And Vomiting    Nausea and vomitting  . Other Rash    Mango    Past Medical History: Past Medical History:  Diagnosis Date  . Anxiety   .  Arthritis   . 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 Depression   . Hepatitis C   . Hyperlipidemia   . Pericarditis 09/2016    Social History: Social History   Socioeconomic History  . Marital status: Married    Spouse name: Not on file  . Number of children: 1  . Years of education: 42  . Highest education level: Not on file  Occupational History  . Occupation: Nurse, children's  Social Needs  . Financial resource strain: Not on file  . Food insecurity:    Worry: Not on file    Inability: Not on file  . Transportation needs:    Medical: Not on file    Non-medical: Not on file  Tobacco Use  . Smoking status: Current Every Day Smoker    Packs/day: 0.50    Years: 30.00    Pack years: 15.00    Types: Cigarettes  . Smokeless tobacco: Never Used  Substance and Sexual Activity  . Alcohol use: Yes    Alcohol/week: 5.0 standard drinks    Types: 5 Glasses of wine per week  . Drug use: Not Currently    Types: LSD, Cocaine    Comment: pt denies any use at this time-per pt  . Sexual activity: Yes    Birth control/protection: Post-menopausal  Lifestyle  . Physical activity:    Days per week: Not on file    Minutes per session: Not on file  . Stress: Not on file  Relationships  . Social  connections:    Talks on phone: Not on file    Gets together: Not on file    Attends religious service: Not on file    Active member of club or organization: Not on file    Attends meetings of clubs or organizations: Not on file    Relationship status: Not on file  Other Topics Concern  . Not on file  Social History Narrative   Fun: Yoga, Armandina Gemma and dogs.    Denies abuse and feels safe at home.     Labs: Hepatitis C Lab Results  Component Value Date   HCVGENOTYPE 1a 07/27/2018   HEPCAB REACTIVE (A) 05/29/2018   HCVRNAPCRQN <15 NOT DETECTED 10/08/2018   HCVRNAPCRQN 112,000 (H) 05/29/2018   Hepatitis B Lab Results  Component Value Date   HEPBSAB REACTIVE (A) 05/29/2018    Hepatitis A Lab Results  Component Value Date   HAV REACTIVE (A) 05/29/2018   HIV Lab Results  Component Value Date   HIV NON-REACTIVE 05/29/2018   HIV Non Reactive 07/05/2017   Lab Results  Component Value Date   CREATININE 0.69 10/08/2018   CREATININE 0.82 05/29/2018   CREATININE 0.48 07/06/2017   CREATININE 0.42 (L) 07/05/2017   CREATININE 0.47 07/04/2017   Lab Results  Component Value Date   AST 20 10/08/2018   AST 52 (H) 05/29/2018   AST 18 07/06/2017   ALT 13 10/08/2018   ALT 44 (H) 05/29/2018   ALT 15 07/06/2017   INR 1.09 06/28/2017    Assessment: Kristine Madden is here today to follow-up for her Hep C infection.  She has 5 days left of her Raeanne Gathers and has not missed any doses.  No issues or side effects.  Her early on treatment Hep C viral load was undetectable back in October. Will check another one today and see her back in 3 months for her cure visit.   Plan: - Finish out Epclusa x 12 weeks - Hep C viral load today - F/u with me for cure visit 3/10 at 230pm  Gabriela Giannelli L. November Sypher, PharmD, BCIDP, AAHIVP, [place] [facility] 11/23/2018, 4:38 PM

## 2018-11-25 LAB — HEPATITIS C RNA QUANTITATIVE
HCV Quantitative Log: <1.18 Log IU/mL
HCV RNA, PCR, QN: NOT DETECTED [IU]/mL

## 2019-03-02 ENCOUNTER — Ambulatory Visit: Payer: Self-pay | Admitting: Pharmacist

## 2019-03-10 ENCOUNTER — Ambulatory Visit (INDEPENDENT_AMBULATORY_CARE_PROVIDER_SITE_OTHER): Payer: Self-pay | Admitting: Pharmacist

## 2019-03-10 ENCOUNTER — Other Ambulatory Visit: Payer: Self-pay

## 2019-03-10 DIAGNOSIS — B182 Chronic viral hepatitis C: Secondary | ICD-10-CM

## 2019-03-10 NOTE — Progress Notes (Signed)
HPI: Kristine Madden is a 53 y.o. female who presents to the [facility] clinic for Hepatitis C follow-up.  Medication: Epclusa x 12 weeks  Start Date: 08/26/18  Hepatitis C Genotype: 1a  Fibrosis Score: F2/F3  Hepatitis C RNA: 112,000 on 05/29/18, <15 on 10/08/18 and 11/23/18  Patient Active Problem List   Diagnosis Date Noted  . Breast lump on right side at 12 o'clock position 02/17/2018  . No-show for appointment 11/18/2017  . Acute non-recurrent frontal sinusitis 09/29/2017  . Endometrial adenocarcinoma ([place]) 06/29/2017  . Postoperative state 06/17/2017  . Encounter for general adult medical examination with abnormal findings 05/27/2017  . Depression 05/27/2017  . Tobacco use 05/27/2017  . Pleuritic chest pain 09/04/2016  . Chronic hepatitis C without hepatic coma ([place]) 09/04/2016    Patient's Medications  New Prescriptions   No medications on file  Previous Medications   IBUPROFEN (ADVIL,MOTRIN) 200 MG TABLET    Take 200 mg by mouth every 6 (six) hours as needed.  Modified Medications   No medications on file  Discontinued Medications   No medications on file    Allergies: Allergies  Allergen Reactions  . Hydrocodone Nausea And Vomiting  . Penicillins Hives    Has patient had a PCN reaction causing immediate rash, facial/tongue/throat swelling, SOB or lightheadedness with hypotension: No Has patient had a PCN reaction causing severe rash involving mucus membranes or skin necrosis: No Has patient had a PCN reaction that required hospitalization No Has patient had a PCN reaction occurring within the last 10 years: No If all of the above answers are "NO", then may proceed with Cephalosporin use.   Marland Kitchen Percocet [Oxycodone-Acetaminophen] Itching and Nausea And Vomiting    Nausea and vomitting  . Other Rash    Mango    Past Medical History: Past Medical History:  Diagnosis Date  . Anxiety   . Arthritis   . 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  Depression   . Hepatitis C   . Hyperlipidemia   . Pericarditis 09/2016    Social History: Social History   Socioeconomic History  . Marital status: Married    Spouse name: Not on file  . Number of children: 1  . Years of education: 20  . Highest education level: Not on file  Occupational History  . Occupation: Nurse, children's  Social Needs  . Financial resource strain: Not on file  . Food insecurity:    Worry: Not on file    Inability: Not on file  . Transportation needs:    Medical: Not on file    Non-medical: Not on file  Tobacco Use  . Smoking status: Current Every Day Smoker    Packs/day: 0.50    Years: 30.00    Pack years: 15.00    Types: Cigarettes  . Smokeless tobacco: Never Used  Substance and Sexual Activity  . Alcohol use: Yes    Alcohol/week: 5.0 standard drinks    Types: 5 Glasses of wine per week  . Drug use: Not Currently    Types: LSD, Cocaine    Comment: pt denies any use at this time-per pt  . Sexual activity: Yes    Birth control/protection: Post-menopausal  Lifestyle  . Physical activity:    Days per week: Not on file    Minutes per session: Not on file  . Stress: Not on file  Relationships  . Social connections:    Talks on phone: Not on file  Gets together: Not on file    Attends religious service: Not on file    Active member of club or organization: Not on file    Attends meetings of clubs or organizations: Not on file    Relationship status: Not on file  Other Topics Concern  . Not on file  Social History Narrative   Fun: Yoga, Armandina Gemma and dogs.    Denies abuse and feels safe at home.     Labs: Hepatitis C Lab Results  Component Value Date   HCVGENOTYPE 1a 07/27/2018   HEPCAB REACTIVE (A) 05/29/2018   HCVRNAPCRQN <15 NOT DETECTED 11/23/2018   HCVRNAPCRQN <15 NOT DETECTED 10/08/2018   HCVRNAPCRQN 112,000 (H) 05/29/2018   Hepatitis B Lab Results  Component Value Date   HEPBSAB REACTIVE (A) 05/29/2018   Hepatitis  A Lab Results  Component Value Date   HAV REACTIVE (A) 05/29/2018   HIV Lab Results  Component Value Date   HIV NON-REACTIVE 05/29/2018   HIV Non Reactive 07/05/2017   Lab Results  Component Value Date   CREATININE 0.69 10/08/2018   CREATININE 0.82 05/29/2018   CREATININE 0.48 07/06/2017   CREATININE 0.42 (L) 07/05/2017   CREATININE 0.47 07/04/2017   Lab Results  Component Value Date   AST 20 10/08/2018   AST 52 (H) 05/29/2018   AST 18 07/06/2017   ALT 13 10/08/2018   ALT 44 (H) 05/29/2018   ALT 15 07/06/2017   INR 1.09 06/28/2017    Assessment: Judeen is here today to follow-up for her chronic Hepatitis C infection.  She completed 12 weeks of Epclusa back in December with no issues - no missed doses or side effects.  Her early on treatment and end of treatment Hep C viral loads were both undetectable.  Discussed the risk of reinfection even after cure and explained how her Hepatitis C antibody will always be positive for the remainder of her life.  Will check another Hep C viral load today for cure.   Plan: - Hep C RNA today for cure  Cassie L. Kuppelweiser, PharmD, BCIDP, AAHIVP, [place] for Infectious [facility] 03/10/2019, 3:06 PM

## 2019-03-13 LAB — HEPATITIS C RNA QUANTITATIVE
HCV Quantitative Log: <1.18 Log IU/mL
HCV RNA, PCR, QN: <15 IU/mL

## 2019-04-29 ENCOUNTER — Encounter: Payer: Self-pay | Admitting: Gastroenterology

## 2020-12-13 ENCOUNTER — Telehealth: Payer: Self-pay | Admitting: Nurse Practitioner

## 2020-12-13 DIAGNOSIS — U071 COVID-19: Secondary | ICD-10-CM

## 2020-12-13 NOTE — Telephone Encounter (Signed)
Called to Discuss with patient about Covid symptoms and the use of a monoclonal antibody infusion for those with mild to moderate Covid symptoms and at a high risk of hospitalization.     Pt is qualified for this infusion at the  infusion center due to co-morbid conditions and/or a member of an at-risk group.     Unable to reach pt. Left message to return call.   Wessley Emert, DNP, AGNP-C [phone] (Infusion Center Hotline)  

## 2020-12-13 NOTE — Telephone Encounter (Signed)
Called to Discuss with patient about Covid symptoms and the use of the monoclonal antibody infusion for those with mild to moderate Covid symptoms and at a high risk of hospitalization.     Symptom onset: 12/16 Vaccinated: yes: via clinical trial Qualified for Infusion: no, due to narrowed criteria. Patient in agreement and says she's doing well.   Symptoms reviewed as well as criteria for ending isolation.  Symptoms reviewed that would warrant ED/Hospital evaluation as well should condition worsen. Preventative practices reviewed. Patient verbalized understanding.   Patient Active Problem List   Diagnosis Date Noted   Breast lump on right side at 12 o'clock position 02/17/2018   No-show for appointment 11/18/2017   Acute non-recurrent frontal sinusitis 09/29/2017   Endometrial adenocarcinoma ([place]) 06/29/2017   Postoperative state 06/17/2017   Encounter for general adult medical examination with abnormal findings 05/27/2017   Depression 05/27/2017   Tobacco use 05/27/2017   Pleuritic chest pain 09/04/2016   Chronic hepatitis C without hepatic coma ([place]) 09/04/2016   Beckey Rutter, NP [phone] [email or web address]

## 2022-07-15 ENCOUNTER — Encounter: Payer: Self-pay | Admitting: Physician Assistant

## 2022-07-15 ENCOUNTER — Ambulatory Visit: Payer: Self-pay

## 2022-07-15 ENCOUNTER — Ambulatory Visit (INDEPENDENT_AMBULATORY_CARE_PROVIDER_SITE_OTHER): Payer: Self-pay | Admitting: Physician Assistant

## 2022-07-15 DIAGNOSIS — M25562 Pain in left knee: Secondary | ICD-10-CM

## 2022-07-15 MED ORDER — LIDOCAINE HCL 1 % IJ SOLN
2.0000 mL | INTRAMUSCULAR | Status: AC | PRN
  Administered 2022-07-15: 2 mL

## 2022-07-15 MED ORDER — BUPIVACAINE HCL 0.25 % IJ SOLN
2.0000 mL | INTRAMUSCULAR | Status: AC | PRN
  Administered 2022-07-15: 2 mL via INTRA_ARTICULAR

## 2022-07-15 MED ORDER — METHYLPREDNISOLONE ACETATE 40 MG/ML IJ SUSP
80.0000 mg | INTRAMUSCULAR | Status: AC | PRN
  Administered 2022-07-15: 80 mg via INTRA_ARTICULAR

## 2022-07-15 NOTE — Progress Notes (Signed)
Office Visit Note   Patient: Kristine Madden           Date of Birth: 12-18-1966           MRN: [id number] Visit Date: 07/15/2022              Requested by: Margot Ables, NP [address],  [place] [address] PCP: Margot Ables, NP  Chief Complaint  Patient presents with  . Left Knee - Follow-up      HPI: Patient is a pleasant 56 year old woman with a chief complaint of left medial knee pain.  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  She thought maybe she twisted a little bit and it had some discoloration and the swelling.  She notices it when she sleeps at night on that side it is painful.  No history of instability  Assessment & Plan: Visit Diagnoses:  1. Acute pain of left knee     Plan: Findings consistent with some medial joint arthritis or perhaps a small meniscus tear given her twisting injury.  We talked about trying a cortisone injection today and seeing how much that helps her.  She is can give it a few weeks and if she is not better she can call here and I will order an MRI.  I have also provided her with exercises to do for knee strengthening per her request.  Follow-Up Instructions: No follow-ups on file.   Ortho Exam  Patient is alert, oriented, no adenopathy, well-dressed, normal affect, normal respiratory effort. Left knee she has no effusion no redness.  She has good range of motion she is focally tender over the posterior medial joint line.  No tenderness laterally no tenderness over the patellofemoral joint  Imaging: No results found. No images are attached to the encounter.  Labs: Lab Results  Component Value Date   REPTSTATUS 07/09/2017 FINAL 07/04/2017   GRAMSTAIN  07/04/2017    MODERATE WBC PRESENT, PREDOMINANTLY PMN ABUNDANT GRAM NEGATIVE COCCI IN PAIRS MODERATE GRAM NEGATIVE RODS ABUNDANT GRAM POSITIVE COCCI IN PAIRS IN CLUSTERS FEW GRAM POSITIVE RODS    CULT (A)  07/04/2017    PREVOTELLA SPECIES BETA LACTAMASE POSITIVE Performed at [facility], [place] [address], [place], [place] [address]    LABORGA METHICILLIN RESISTANT STAPHYLOCOCCUS AUREUS 08/17/2013     Lab Results  Component Value Date   ALBUMIN 4.6 05/29/2018   ALBUMIN 2.4 (L) 07/06/2017   ALBUMIN 2.2 (L) 07/05/2017    No results found for: "MG" No results found for: "VD25OH"  No results found for: "PREALBUMIN"    Latest Ref Rng & Units 05/29/2018   11:29 AM 07/17/2017   10:17 AM 07/06/2017    5:02 AM  CBC EXTENDED  WBC 4.0 - 10.5 K/uL 6.2  7.4  11.4   RBC 3.87 - 5.11 Mil/uL 5.00  3.46  2.95   Hemoglobin 12.0 - 15.0 g/dL 16.3  10.4  9.2   HCT 36.0 - 46.0 % 47.0  32.5  26.4   Platelets 150.0 - 400.0 K/uL 265.0  592  724   NEUT# 1,500 - 7,800 cells/uL  2,886  6.0   Lymph# 850 - 3,900 cells/uL  2,516  2.3      There is no height or weight on file to calculate BMI.  Orders:  Orders Placed This Encounter  Procedures  . XR KNEE 3 VIEW LEFT   No orders of  the defined types were placed in this encounter.    Procedures: Large Joint Inj: L knee on 07/15/2022 4:47 PM Indications: pain and diagnostic evaluation Details: 25 G 1.5 in needle, anteromedial approach  Arthrogram: No  Medications: 80 mg methylPREDNISolone acetate 40 MG/ML; 2 mL lidocaine 1 %; 2 mL bupivacaine 0.25 % Outcome: tolerated well, no immediate complications Procedure, treatment alternatives, risks and benefits explained, specific risks discussed. Consent was given by the patient.     Clinical Data: No additional findings.  ROS:  All other systems negative, except as noted in the HPI. Review of Systems  Objective: Vital Signs: There were no vitals taken for this visit.  Specialty Comments:  No specialty comments available.  PMFS History: Patient Active Problem List   Diagnosis Date Noted  . Pain in left knee 07/15/2022  . Breast lump on right side at 12 o'clock position 02/17/2018  .  No-show for appointment 11/18/2017  . Acute non-recurrent frontal sinusitis 09/29/2017  . Endometrial adenocarcinoma ([place]) 06/29/2017  . Postoperative state 06/17/2017  . Encounter for general adult medical examination with abnormal findings 05/27/2017  . Depression 05/27/2017  . Tobacco use 05/27/2017  . Pleuritic chest pain 09/04/2016  . Chronic hepatitis C without hepatic coma ([place]) 09/04/2016   Past Medical History:  Diagnosis Date  . Anxiety   . Arthritis   . 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 Depression   . Hepatitis C   . Hyperlipidemia   . Pericarditis 09/2016    Family History  Problem Relation Age of Onset  . Arthritis Mother   . Hyperlipidemia Mother   . Hypertension Father   . Colon polyps Father   . Colon cancer Neg Hx   . Esophageal cancer Neg Hx   . Rectal cancer Neg Hx   . Stomach cancer Neg Hx     Past Surgical History:  Procedure Laterality Date  . DILATATION & CURETTAGE/HYSTEROSCOPY WITH MYOSURE N/A 01/03/2017   Procedure: [place];  Surgeon: [name], MD;  Location: [facility];  Service: Gynecology;  Laterality: N/A;  . EYE SURGERY    . HEMORRHOID SURGERY    . KNEE ARTHROSCOPY    . LAPAROSCOPY N/A 06/28/2017   Procedure: VAGINAL EXAM UNDER ANESTHESIA,LAPAROSCOPY DIAGNOSTIC, EXPLORATORY LAPAROTOMY WITH EVACUATION OF HEMATOMA;  Surgeon: [name], MD;  Location: [facility];  Service: Gynecology;  Laterality: N/A;  . 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 REPAIR VAGINAL CUFF N/A 06/27/2017   Procedure: REPAIR VAGINAL CUFF;  Surgeon: [name], MD;  Location: [facility];  Service: Gynecology;  Laterality: N/A;  . ROBOTIC ASSISTED TOTAL HYSTERECTOMY WITH UTEROSACRAL LIGAMENT SUSPENSION Bilateral 06/17/2017   Procedure: ROBOTIC ASSISTED TOTAL HYSTERECTOMY, Bilateral Salpingectomy,  UTEROSACRAL LIGAMENT SUSPENSION;  Surgeon: [name], MD;  Location: [facility];  Service: Gynecology;  Laterality:  Bilateral;     RNFA confirmed on 05/12/17  Dr. Dellis Filbert assisting in [place] room 3  . SPLENECTOMY, TOTAL    . TONSILLECTOMY    . TUBAL LIGATION     Social History   Occupational History  . Occupation: Nurse, children's  Tobacco Use  . Smoking status: Every Day    Packs/day: 0.50    Years: 30.00    Total pack years: 15.00    Types: Cigarettes  . Smokeless tobacco: Never  Vaping Use  . Vaping Use: Never used  Substance and Sexual Activity  . Alcohol use: Yes    Alcohol/week: 5.0 standard drinks  of alcohol    Types: 5 Glasses of wine per week  . Drug use: Not Currently    Types: LSD, Cocaine    Comment: pt denies any use at this time-per pt  . Sexual activity: Yes    Birth control/protection: Post-menopausal

## 2023-03-18 ENCOUNTER — Ambulatory Visit
Admission: RE | Admit: 2023-03-18 | Discharge: 2023-03-18 | Disposition: A | Discharge status: Home / Self Care | Payer: Self-pay | Source: Ambulatory Visit | Attending: Family Medicine | Admitting: Family Medicine

## 2023-03-18 ENCOUNTER — Other Ambulatory Visit: Payer: Self-pay

## 2023-03-18 VITALS — BP 178/89 | HR 82 | Temp 98.1°F | Resp 18

## 2023-03-18 DIAGNOSIS — J069 Acute upper respiratory infection, unspecified: Secondary | ICD-10-CM | POA: Insufficient documentation

## 2023-03-18 DIAGNOSIS — J029 Acute pharyngitis, unspecified: Secondary | ICD-10-CM | POA: Insufficient documentation

## 2023-03-18 LAB — POCT RAPID STREP A (OFFICE): Rapid Strep A Screen: NEGATIVE

## 2023-03-18 NOTE — Discharge Instructions (Addendum)
You were seen today for continued upper respiratory symptoms.  Your rapid strep was negative but we will send this for culture.  If this is positive we will call to notify you for treatment.  There is no evidence of bacterial infection at this time.  I recommend you get over the counter claritin/zyrtec/sudafed for sinus symptoms.  You should also trial flonase.  If you have worsening symptoms into next week then please return for re-evaluation.

## 2023-03-18 NOTE — ED Provider Notes (Signed)
[facility]    CSN: [id number] Arrival date & time: 03/18/23  1555      History   Chief Complaint Chief Complaint  Patient presents with   Sore Throat    Sore throat, swollen glands - now ear is starting to hurt.  Went to [facility] urgent care, full cycle of antibiotic - Cefdinir 300 mg, 2x/10 days - & called back after 3 days - not feeling better - and took steroid - Prednisone 10 mg for 6 days. - Entered by patient   Otalgia    HPI Kristine Madden is a 57 y.o. female.    She went to the UC 3 weeks ago with cough, sore throat.   No strep test was done, but given omnicef bid x 10 days, but did not really help after 3 days.  She called them back and was given prednisone.  She does feel better, but not gone totally.  She is having pain at the right side of the throat, into the right ear.  Gland feels swollen on the right.  Sore throat is the worst issue at the moment.  Cough is worse at night.  Dry cough, but hard.  Very little runny nose, congestion, drainage.  No fevers since the urgent care visit.  Did not change her toothbrush or launder anything as she was not told to do so.        Past Medical History:  Diagnosis Date   Anxiety    Arthritis    Cancer ([place]) 2005   islet cell tumor   Depression    Hepatitis C    Hyperlipidemia    Pericarditis 09/2016    Patient Active Problem List   Diagnosis Date Noted   Pain in left knee 07/15/2022   Breast lump on right side at 12 o'clock position 02/17/2018   No-show for appointment 11/18/2017   Acute non-recurrent frontal sinusitis 09/29/2017   Endometrial adenocarcinoma ([place]) 06/29/2017   Postoperative state 06/17/2017   Encounter for general adult medical examination with abnormal findings 05/27/2017   Depression 05/27/2017   Tobacco use 05/27/2017   Pleuritic chest pain 09/04/2016   Chronic hepatitis C without hepatic coma ([place]) 09/04/2016    Past Surgical History:  Procedure Laterality Date    DILATATION & CURETTAGE/HYSTEROSCOPY WITH MYOSURE N/A 01/03/2017   Procedure: [place];  Surgeon: [name], MD;  Location: [facility];  Service: Gynecology;  Laterality: N/A;   EYE SURGERY     HEMORRHOID SURGERY     KNEE ARTHROSCOPY     LAPAROSCOPY N/A 06/28/2017   Procedure: VAGINAL EXAM UNDER ANESTHESIA,LAPAROSCOPY DIAGNOSTIC, EXPLORATORY LAPAROTOMY WITH EVACUATION OF HEMATOMA;  Surgeon: [name], MD;  Location: [facility];  Service: Gynecology;  Laterality: N/A;   PANCREATECTOMY     1/2 pancreas removed   REPAIR VAGINAL CUFF N/A 06/27/2017   Procedure: REPAIR VAGINAL CUFF;  Surgeon: [name], MD;  Location: [facility];  Service: Gynecology;  Laterality: N/A;   ROBOTIC ASSISTED TOTAL HYSTERECTOMY WITH UTEROSACRAL LIGAMENT SUSPENSION Bilateral 06/17/2017   Procedure: ROBOTIC ASSISTED TOTAL HYSTERECTOMY, Bilateral Salpingectomy,  UTEROSACRAL LIGAMENT SUSPENSION;  Surgeon: [name], MD;  Location: [facility];  Service: Gynecology;  Laterality: Bilateral;     RNFA confirmed on 05/12/17  Dr. Dellis Filbert assisting in [place] room 3   SPLENECTOMY, TOTAL     TONSILLECTOMY     TUBAL LIGATION      OB History     Gravida  1   Para  1   Term  1   Preterm      AB      Living  1      SAB      IAB      Ectopic      Multiple      Live Births               Home Medications    Prior to Admission medications   Medication Sig Start Date End Date Taking? Authorizing Provider  ibuprofen (ADVIL,MOTRIN) 200 MG tablet Take 200 mg by mouth every 6 (six) hours as needed.    [provider]    Family History Family History  Problem Relation Age of Onset   Arthritis Mother    Hyperlipidemia Mother    Hypertension Father    Colon polyps Father    Colon cancer Neg Hx    Esophageal cancer Neg Hx    Rectal cancer Neg Hx    Stomach cancer Neg Hx     Social History Social History   Tobacco Use   Smoking status: Every  Day    Packs/day: 0.50    Years: 30.00    Additional pack years: 0.00    Total pack years: 15.00    Types: Cigarettes   Smokeless tobacco: Never  Vaping Use   Vaping Use: Never used  Substance Use Topics   Alcohol use: Yes    Alcohol/week: 5.0 standard drinks of alcohol    Types: 5 Glasses of wine per week   Drug use: Not Currently    Types: LSD, Cocaine    Comment: pt denies any use at this time-per pt     Allergies   Hydrocodone, Penicillins, Percocet [oxycodone-acetaminophen], and Other   Review of Systems Review of Systems  Constitutional:  Negative for chills and fever.  HENT:  Positive for congestion, ear pain and sore throat.   Respiratory:  Positive for cough.   Cardiovascular: Negative.   Gastrointestinal: Negative.   Genitourinary: Negative.   Musculoskeletal: Negative.   Psychiatric/Behavioral: Negative.       Physical Exam Triage Vital Signs ED Triage Vitals [03/18/23 1618]  Enc Vitals Group     BP (!) 178/89     Pulse Rate 82     Resp 18     Temp 98.1 F (36.7 C)     Temp Source Oral     SpO2 95 %     Weight      Height      Head Circumference      Peak Flow      Pain Score 4     Pain Loc      Pain Edu?      Excl. in [place]?    No data found.  Updated Vital Signs BP (!) 178/89 (BP Location: Left Arm)   Pulse 82   Temp 98.1 F (36.7 C) (Oral)   Resp 18   SpO2 95%   Visual Acuity Right Eye Distance:   Left Eye Distance:   Bilateral Distance:    Right Eye Near:   Left Eye Near:    Bilateral Near:     Physical Exam Constitutional:      Appearance: She is well-developed.  HENT:     Right Ear: A middle ear effusion is present.     Left Ear: A middle ear effusion is present.     Nose:     Right Sinus: No maxillary sinus tenderness or frontal sinus tenderness.  Left Sinus: No maxillary sinus tenderness or frontal sinus tenderness.     Mouth/Throat:     Mouth: Mucous membranes are moist.     Pharynx: Oropharyngeal exudate and  posterior oropharyngeal erythema present. No pharyngeal swelling or uvula swelling.     Tonsils: No tonsillar exudate.  Cardiovascular:     Rate and Rhythm: Normal rate and regular rhythm.     Heart sounds: Normal heart sounds.  Pulmonary:     Effort: Pulmonary effort is normal.     Breath sounds: Rhonchi present. No wheezing or rales.  Musculoskeletal:     Cervical back: Normal range of motion and neck supple.  Lymphadenopathy:     Cervical: No cervical adenopathy.  Skin:    General: Skin is warm.  Neurological:     General: No focal deficit present.     Mental Status: She is alert.  Psychiatric:        Mood and Affect: Mood normal.      UC Treatments / Results  Labs (all labs ordered are listed, but only abnormal results are displayed) Labs Reviewed  CULTURE, GROUP A STREP [facility])  POCT RAPID STREP A (OFFICE)    EKG   Radiology No results found.  Procedures Procedures (including critical care time)  Medications Ordered in UC Medications - No data to display  Initial Impression / Assessment and Plan / UC Course  I have reviewed the triage vital signs and the nursing notes.  Pertinent labs & imaging results that were available during my care of the patient were reviewed by me and considered in my medical decision making (see chart for details).  Final Clinical Impressions(s) / UC Diagnoses   Final diagnoses:  Sore throat  Acute upper respiratory infection     Discharge Instructions      You were seen today for continued upper respiratory symptoms.  Your rapid strep was negative but we will send this for culture.  If this is positive we will call to notify you for treatment.  There is no evidence of bacterial infection at this time.  I recommend you get over the counter claritin/zyrtec/sudafed for sinus symptoms.  You should also trial flonase.  If you have worsening symptoms into next week then please return for re-evaluation.      ED Prescriptions    None    PDMP not reviewed this encounter.   Rondel Oh, MD 03/18/23 [phone]

## 2023-03-18 NOTE — ED Triage Notes (Signed)
Pt here for continued sore throat and right ear pain x 10 days; pt recently completed antibiotics

## 2023-03-19 LAB — CULTURE, GROUP A STREP (THRC)

## 2023-03-20 LAB — CULTURE, GROUP A STREP (THRC)

## 2023-03-22 ENCOUNTER — Ambulatory Visit
Admission: RE | Admit: 2023-03-22 | Discharge: 2023-03-22 | Disposition: A | Discharge status: Home / Self Care | Payer: Self-pay | Source: Ambulatory Visit | Attending: Physician Assistant | Admitting: Physician Assistant

## 2023-03-22 VITALS — BP 153/104 | HR 85 | Temp 97.2°F | Resp 17

## 2023-03-22 DIAGNOSIS — N3001 Acute cystitis with hematuria: Secondary | ICD-10-CM

## 2023-03-22 LAB — POCT URINALYSIS DIP (MANUAL ENTRY)
Bilirubin, UA: NEGATIVE
Glucose, UA: NEGATIVE mg/dL
Ketones, POC UA: NEGATIVE mg/dL
Nitrite, UA: NEGATIVE
Protein Ur, POC: 30 mg/dL — AB
Spec Grav, UA: 1.015 (ref 1.010–1.025)
Urobilinogen, UA: 0.2 E.U./dL
pH, UA: 8 (ref 5.0–8.0)

## 2023-03-22 MED ORDER — NITROFURANTOIN MONOHYD MACRO 100 MG PO CAPS: 100.0000 mg | ORAL_CAPSULE | Freq: Two times a day (BID) | ORAL | 0 refills | Status: DC

## 2023-03-22 NOTE — ED Triage Notes (Signed)
Pt presents with chills, urinary frequency, burning after urination, and blood in urine since yesterday.

## 2023-03-22 NOTE — Discharge Instructions (Signed)
I believe you have a urinary tract infection.  Please start nitrofurantoin twice daily for 5 days.  We are sending her urine for culture and we will contact you if you need to change her antibiotics.  Make sure that you are drinking plenty of fluid.  If anything worsens and you have increasing pain, persistent blood in your urine, fever, nausea, vomiting, abdominal pain, weakness you need to be seen immediately.  Someone should contact you to schedule an appointment as a new primary care provider.  I do recommend you follow-up with them to consider an echocardiogram since we heard a murmur today.  If you develop any symptoms please return for reevaluation.

## 2023-03-22 NOTE — ED Provider Notes (Signed)
[facility]    CSN: [id number] Arrival date & time: 03/22/23  [id number]      History   Chief Complaint Chief Complaint  Patient presents with   Urinary Frequency    chills after I urinate, some blood in urine - Entered by patient    HPI Kristine Madden is a 57 y.o. female.   Patient presents today with a 24-hour history of UTI symptoms.  Reports chills, urinary frequency, dysuria, hematuria, urgency.  Denies any abdominal pain, nausea, vomiting, fever.  Denies any recent urogenital procedure, self-catheterization, history of nephrolithiasis.  Denies history of diabetes and does not take SGLT2 inhibitor.  She has tried ibuprofen this morning but has not taken additional over-the-counter medications for symptom management.  She was treated with antibiotics (Omnicef) and steroids within the month for sinus and sore throat symptoms but has not had any additional antibiotics in the past 90 days.  No concern for pregnancy as she is status post hysterectomy.    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   Depression    Hepatitis C    Hyperlipidemia    Pericarditis 09/2016    Patient Active Problem List   Diagnosis Date Noted   Pain in left knee 07/15/2022   Breast lump on right side at 12 o'clock position 02/17/2018   No-show for appointment 11/18/2017   Acute non-recurrent frontal sinusitis 09/29/2017   Endometrial adenocarcinoma ([place]) 06/29/2017   Postoperative state 06/17/2017   Encounter for general adult medical examination with abnormal findings 05/27/2017   Depression 05/27/2017   Tobacco use 05/27/2017   Pleuritic chest pain 09/04/2016   Chronic hepatitis C without hepatic coma ([place]) 09/04/2016    Past Surgical History:  Procedure Laterality Date   DILATATION & CURETTAGE/HYSTEROSCOPY WITH MYOSURE N/A 01/03/2017   Procedure: [place];  Surgeon: [name], MD;  Location: [facility];  Service: Gynecology;  Laterality: N/A;   EYE SURGERY     HEMORRHOID SURGERY     KNEE ARTHROSCOPY     LAPAROSCOPY N/A 06/28/2017   Procedure: VAGINAL EXAM UNDER ANESTHESIA,LAPAROSCOPY DIAGNOSTIC, EXPLORATORY LAPAROTOMY WITH EVACUATION OF HEMATOMA;  Surgeon: [name], MD;  Location: [facility];  Service: Gynecology;  Laterality: N/A;   PANCREATECTOMY     1/2 pancreas removed   REPAIR VAGINAL CUFF N/A 06/27/2017   Procedure: REPAIR VAGINAL CUFF;  Surgeon: [name], MD;  Location: [facility];  Service: Gynecology;  Laterality: N/A;   ROBOTIC ASSISTED TOTAL HYSTERECTOMY WITH UTEROSACRAL LIGAMENT SUSPENSION Bilateral 06/17/2017   Procedure: ROBOTIC ASSISTED TOTAL HYSTERECTOMY, Bilateral Salpingectomy,  UTEROSACRAL LIGAMENT SUSPENSION;  Surgeon: [name], MD;  Location: [facility];  Service: Gynecology;  Laterality: Bilateral;     RNFA confirmed on 05/12/17  Dr. Dellis Filbert assisting in OR room 3   SPLENECTOMY, TOTAL     TONSILLECTOMY     TUBAL LIGATION      OB History     Gravida  1   Para  1   Term  1   Preterm      AB      Living  1      SAB      IAB      Ectopic      Multiple      Live Births               Home Medications  Prior to Admission medications   Medication Sig Start Date End Date Taking? Authorizing Provider  nitrofurantoin, macrocrystal-monohydrate, (MACROBID) 100 MG capsule Take 1 capsule (100 mg total) by mouth 2 (two) times daily. 03/22/23  Yes Raevon Broom K, PA-C  ibuprofen (ADVIL,MOTRIN) 200 MG tablet Take 200 mg by mouth every 6 (six) hours as needed.    [provider]    Family History Family History  Problem Relation Age of Onset   Arthritis Mother    Hyperlipidemia Mother    Hypertension Father    Colon polyps Father    Colon cancer Neg Hx    Esophageal cancer Neg Hx    Rectal cancer Neg Hx    Stomach cancer Neg Hx     Social History Social History   Tobacco Use    Smoking status: Every Day    Packs/day: 0.50    Years: 30.00    Additional pack years: 0.00    Total pack years: 15.00    Types: Cigarettes   Smokeless tobacco: Never  Vaping Use   Vaping Use: Never used  Substance Use Topics   Alcohol use: Yes    Alcohol/week: 5.0 standard drinks of alcohol    Types: 5 Glasses of wine per week   Drug use: Not Currently    Types: LSD, Cocaine    Comment: pt denies any use at this time-per pt     Allergies   Hydrocodone, Penicillins, Percocet [oxycodone-acetaminophen], and Other   Review of Systems Review of Systems  Constitutional:  Positive for activity change and chills. Negative for appetite change, fatigue and fever.  Gastrointestinal:  Negative for abdominal pain, diarrhea, nausea and vomiting.  Genitourinary:  Positive for dysuria, frequency, hematuria and urgency. Negative for pelvic pain, vaginal bleeding, vaginal discharge and vaginal pain.     Physical Exam Triage Vital Signs ED Triage Vitals  Enc Vitals Group     BP 03/22/23 0950 (!) 153/104     Pulse Rate 03/22/23 0950 85     Resp 03/22/23 0950 17     Temp 03/22/23 0950 (!) 97.2 F (36.2 C)     Temp Source 03/22/23 0950 Oral     SpO2 03/22/23 0950 95 %     Weight --      Height --      Head Circumference --      Peak Flow --      Pain Score 03/22/23 0952 6     Pain Loc --      Pain Edu? --      Excl. in [place]? --    No data found.  Updated Vital Signs BP (!) 153/104 (BP Location: Left Arm)   Pulse 85   Temp (!) 97.2 F (36.2 C) (Oral)   Resp 17   SpO2 95%   Visual Acuity Right Eye Distance:   Left Eye Distance:   Bilateral Distance:    Right Eye Near:   Left Eye Near:    Bilateral Near:     Physical Exam Vitals reviewed.  Constitutional:      General: She is awake. She is not in acute distress.    Appearance: Normal appearance. She is well-developed. She is not ill-appearing.     Comments: Very pleasant female appears stated age in no acute distress  sitting comfortably in exam room clear to auscultation by  HENT:     Head: Normocephalic and atraumatic.  Cardiovascular:     Rate and Rhythm: Normal rate and regular rhythm.  Heart sounds: S1 normal and S2 normal. Murmur heard.  Pulmonary:     Effort: Pulmonary effort is normal.     Breath sounds: Normal breath sounds. No wheezing, rhonchi or rales.     Comments: Clear to auscultation bilaterally Abdominal:     General: Bowel sounds are normal.     Palpations: Abdomen is soft.     Tenderness: There is no abdominal tenderness. There is no right CVA tenderness, left CVA tenderness, guarding or rebound.     Comments: Benign abdominal exam  Psychiatric:        Behavior: Behavior is cooperative.      UC Treatments / Results  Labs (all labs ordered are listed, but only abnormal results are displayed) Labs Reviewed  POCT URINALYSIS DIP (MANUAL ENTRY) - Abnormal; Notable for the following components:      Result Value   Clarity, UA cloudy (*)    Blood, UA large (*)    Protein Ur, POC =30 (*)    Leukocytes, UA Moderate (2+) (*)    All other components within normal limits  URINE CULTURE    EKG   Radiology No results found.  Procedures Procedures (including critical care time)  Medications Ordered in UC Medications - No data to display  Initial Impression / Assessment and Plan / UC Course  I have reviewed the triage vital signs and the nursing notes.  Pertinent labs & imaging results that were available during my care of the patient were reviewed by me and considered in my medical decision making (see chart for details).     Patient is well-appearing, afebrile, nontoxic, nontachycardic.  Vital signs and physical exam are reassuring with no indication for emergent evaluation or imaging.  UA consistent with urinary tract infection with large blood and leukocytes.  Patient was started on Macrobid twice daily for 5 days given history of penicillin allergy.  She was  encouraged to push fluids.  Will send urine for culture, discussed that we will contact her if we need to change her treatment based on these results.  Discussed that if she has any worsening or changing symptoms including abdominal pain, pelvic pain, fever, nausea, vomiting she needs to be seen immediately.  Strict return precautions given.  Patient was noted to have a murmur on exam.  Reports that she has intermittently been told about this but has never had it worked up.  She does not currently have a primary care so we will try to establish her with someone via PCP assistance.  Discussed that it be worthwhile to follow-up with them to consider echocardiogram.  If she develops any symptoms she is to return for reevaluation.  Final Clinical Impressions(s) / UC Diagnoses   Final diagnoses:  Acute cystitis with hematuria     Discharge Instructions      I believe you have a urinary tract infection.  Please start nitrofurantoin twice daily for 5 days.  We are sending her urine for culture and we will contact you if you need to change her antibiotics.  Make sure that you are drinking plenty of fluid.  If anything worsens and you have increasing pain, persistent blood in your urine, fever, nausea, vomiting, abdominal pain, weakness you need to be seen immediately.  Someone should contact you to schedule an appointment as a new primary care provider.  I do recommend you follow-up with them to consider an echocardiogram since we heard a murmur today.  If you develop any symptoms please return for reevaluation.  ED Prescriptions     Medication Sig Dispense Auth. Provider   nitrofurantoin, macrocrystal-monohydrate, (MACROBID) 100 MG capsule Take 1 capsule (100 mg total) by mouth 2 (two) times daily. 10 capsule Neya Creegan, Derry Skill, PA-C      PDMP not reviewed this encounter.   Terrilee Croak, PA-C 03/22/23 1010

## 2023-03-24 LAB — URINE CULTURE

## 2023-03-25 LAB — URINE CULTURE: Culture: 50000 — AB

## 2023-04-04 ENCOUNTER — Ambulatory Visit
Admission: RE | Admit: 2023-04-04 | Discharge: 2023-04-04 | Disposition: A | Discharge status: Home / Self Care | Payer: Self-pay | Source: Ambulatory Visit | Attending: Family Medicine | Admitting: Family Medicine

## 2023-04-04 VITALS — BP 130/90 | HR 77 | Temp 97.6°F | Resp 18

## 2023-04-04 DIAGNOSIS — N3000 Acute cystitis without hematuria: Secondary | ICD-10-CM

## 2023-04-04 LAB — POCT URINALYSIS DIP (MANUAL ENTRY)
Bilirubin, UA: NEGATIVE
Glucose, UA: NEGATIVE mg/dL
Ketones, POC UA: NEGATIVE mg/dL
Nitrite, UA: NEGATIVE
Protein Ur, POC: 30 mg/dL — AB
Spec Grav, UA: 1.02 (ref 1.010–1.025)
Urobilinogen, UA: 0.2 E.U./dL
pH, UA: 7 (ref 5.0–8.0)

## 2023-04-04 MED ORDER — SULFAMETHOXAZOLE-TRIMETHOPRIM 800-160 MG PO TABS: 1.0000 | ORAL_TABLET | Freq: Two times a day (BID) | ORAL | 0 refills | Status: AC

## 2023-04-04 NOTE — Discharge Instructions (Signed)
You were seen today for continued UTI symptoms.  Your urine still shows slight infection.  I have sent out bactrim twice/day x 5 days.  The culture will be resulted in several days and you will be notified if a change in treatment is needed.

## 2023-04-04 NOTE — ED Triage Notes (Signed)
Pt presents for follow up after being treated for urinary tract infection with E.Coli in her urine X 2 weeks ago; pt states she is still have some urinary urgency and tingling X 3 days.

## 2023-04-04 NOTE — ED Provider Notes (Signed)
[facility]    CSN: [id number] Arrival date & time: 04/04/23  0951      History   Chief Complaint Chief Complaint  Patient presents with   Follow-up    HPI Kristine Madden is a 57 y.o. female.   Patient is here for recurrence of UTI symptoms.  She was seen here on 3/30 for UTI symptoms.  Treated with macrobid, culture grew salmonella, not sensitive to macrobid.  Her symptoms did improve some.  Health dept did call her as well.  However, She has had some urinary symptoms the last 3 days.  Having urinary urgency, and having a small "tingling" at the end of urination.  No abdominal pain or back pain at this time. No urinary frequency per se.  She does not really have a h/o UTI's in general.   Past Medical History:  Diagnosis Date   Anxiety    Arthritis    Cancer 2005   islet cell tumor   Depression    Hepatitis C    Hyperlipidemia    Pericarditis 09/2016    Patient Active Problem List   Diagnosis Date Noted   Pain in left knee 07/15/2022   Breast lump on right side at 12 o'clock position 02/17/2018   No-show for appointment 11/18/2017   Acute non-recurrent frontal sinusitis 09/29/2017   Endometrial adenocarcinoma 06/29/2017   Postoperative state 06/17/2017   Encounter for general adult medical examination with abnormal findings 05/27/2017   Depression 05/27/2017   Tobacco use 05/27/2017   Pleuritic chest pain 09/04/2016   Chronic hepatitis C without hepatic coma 09/04/2016    Past Surgical History:  Procedure Laterality Date   DILATATION & CURETTAGE/HYSTEROSCOPY WITH MYOSURE N/A 01/03/2017   Procedure: DILATATION & CURETTAGE/HYSTEROSCOPY WITH MYOSURE;  Surgeon: Genia Del, MD;  Location: [facility];  Service: Gynecology;  Laterality: N/A;   EYE SURGERY     HEMORRHOID SURGERY     KNEE ARTHROSCOPY     LAPAROSCOPY N/A 06/28/2017   Procedure: VAGINAL EXAM UNDER ANESTHESIA,LAPAROSCOPY DIAGNOSTIC, EXPLORATORY LAPAROTOMY WITH EVACUATION OF HEMATOMA;   Surgeon: Genia Del, MD;  Location: [facility];  Service: Gynecology;  Laterality: N/A;   PANCREATECTOMY     1/2 pancreas removed   REPAIR VAGINAL CUFF N/A 06/27/2017   Procedure: REPAIR VAGINAL CUFF;  Surgeon: Genia Del, MD;  Location: [facility];  Service: Gynecology;  Laterality: N/A;   ROBOTIC ASSISTED TOTAL HYSTERECTOMY WITH UTEROSACRAL LIGAMENT SUSPENSION Bilateral 06/17/2017   Procedure: ROBOTIC ASSISTED TOTAL HYSTERECTOMY, Bilateral Salpingectomy,  UTEROSACRAL LIGAMENT SUSPENSION;  Surgeon: Genia Del, MD;  Location: [facility];  Service: Gynecology;  Laterality: Bilateral;     RNFA confirmed on 05/12/17  Dr. Seymour Bars assisting in OR room 3   SPLENECTOMY, TOTAL     TONSILLECTOMY     TUBAL LIGATION      OB History     Gravida  1   Para  1   Term  1   Preterm      AB      Living  1      SAB      IAB      Ectopic      Multiple      Live Births               Home Medications    Prior to Admission medications   Medication Sig Start Date End Date Taking? Authorizing Provider  ibuprofen (ADVIL,MOTRIN) 200 MG tablet Take 200 mg by mouth every 6 (six) hours  as needed.    [provider]  nitrofurantoin, macrocrystal-monohydrate, (MACROBID) 100 MG capsule Take 1 capsule (100 mg total) by mouth 2 (two) times daily. 03/22/23   Raspet, Noberto Retort, PA-C    Family History Family History  Problem Relation Age of Onset   Arthritis Mother    Hyperlipidemia Mother    Hypertension Father    Colon polyps Father    Colon cancer Neg Hx    Esophageal cancer Neg Hx    Rectal cancer Neg Hx    Stomach cancer Neg Hx     Social History Social History   Tobacco Use   Smoking status: Every Day    Packs/day: 0.50    Years: 30.00    Additional pack years: 0.00    Total pack years: 15.00    Types: Cigarettes   Smokeless tobacco: Never  Vaping Use   Vaping Use: Never used  Substance Use Topics   Alcohol use: Yes    Alcohol/week: 5.0 standard  drinks of alcohol    Types: 5 Glasses of wine per week   Drug use: Not Currently    Types: LSD, Cocaine    Comment: pt denies any use at this time-per pt     Allergies   Hydrocodone, Penicillins, Percocet [oxycodone-acetaminophen], and Other   Review of Systems Review of Systems  Constitutional: Negative.   HENT: Negative.    Respiratory: Negative.    Cardiovascular: Negative.   Gastrointestinal: Negative.   Genitourinary:  Positive for dysuria and urgency.  Musculoskeletal: Negative.   Psychiatric/Behavioral: Negative.       Physical Exam Triage Vital Signs ED Triage Vitals  Enc Vitals Group     BP 04/04/23 0959 (!) 130/90     Pulse Rate 04/04/23 0959 77     Resp 04/04/23 0959 18     Temp 04/04/23 0959 97.6 F (36.4 C)     Temp Source 04/04/23 0959 Oral     SpO2 04/04/23 0959 93 %     Weight --      Height --      Head Circumference --      Peak Flow --      Pain Score 04/04/23 1009 0     Pain Loc --      Pain Edu? --      Excl. in [place]? --    No data found.  Updated Vital Signs BP (!) 130/90 (BP Location: Right Arm)   Pulse 77   Temp 97.6 F (36.4 C) (Oral)   Resp 18   SpO2 93%   Visual Acuity Right Eye Distance:   Left Eye Distance:   Bilateral Distance:    Right Eye Near:   Left Eye Near:    Bilateral Near:     Physical Exam Constitutional:      Appearance: Normal appearance.  Cardiovascular:     Rate and Rhythm: Normal rate.  Pulmonary:     Effort: Pulmonary effort is normal.  Neurological:     General: No focal deficit present.     Mental Status: She is alert.  Psychiatric:        Mood and Affect: Mood normal.      UC Treatments / Results  Labs (all labs ordered are listed, but only abnormal results are displayed) Labs Reviewed  POCT URINALYSIS DIP (MANUAL ENTRY) - Abnormal; Notable for the following components:      Result Value   Blood, UA small (*)    Protein Ur, POC =30 (*)  Leukocytes, UA Small (1+) (*)    All other  components within normal limits  URINE CULTURE    EKG   Radiology No results found.  Procedures Procedures (including critical care time)  Medications Ordered in UC Medications - No data to display  Initial Impression / Assessment and Plan / UC Course  I have reviewed the triage vital signs and the nursing notes.  Pertinent labs & imaging results that were available during my care of the patient were reviewed by me and considered in my medical decision making (see chart for details).   Final Clinical Impressions(s) / UC Diagnoses   Final diagnoses:  Acute cystitis without hematuria     Discharge Instructions      You were seen today for continued UTI symptoms.  Your urine still shows slight infection.  I have sent out bactrim twice/day x 5 days.  The culture will be resulted in several days and you will be notified if a change in treatment is needed.      ED Prescriptions     Medication Sig Dispense Auth. Provider   sulfamethoxazole-trimethoprim (BACTRIM DS) 800-160 MG tablet Take 1 tablet by mouth 2 (two) times daily for 5 days. 10 tablet Jannifer Franklin, MD      PDMP not reviewed this encounter.   Jannifer Franklin, MD 04/04/23 1032

## 2023-04-05 LAB — URINE CULTURE: Culture: <10000 — AB

## 2023-04-16 LAB — URINE CULTURE

## 2023-04-24 ENCOUNTER — Encounter (HOSPITAL_COMMUNITY): Payer: Self-pay

## 2023-05-15 ENCOUNTER — Encounter: Payer: Self-pay | Admitting: Physician Assistant

## 2023-05-15 ENCOUNTER — Ambulatory Visit (INDEPENDENT_AMBULATORY_CARE_PROVIDER_SITE_OTHER): Payer: Self-pay | Admitting: Physician Assistant

## 2023-05-15 ENCOUNTER — Other Ambulatory Visit (INDEPENDENT_AMBULATORY_CARE_PROVIDER_SITE_OTHER): Payer: Self-pay

## 2023-05-15 DIAGNOSIS — M25561 Pain in right knee: Secondary | ICD-10-CM

## 2023-05-15 DIAGNOSIS — G8929 Other chronic pain: Secondary | ICD-10-CM

## 2023-05-15 MED ORDER — LIDOCAINE HCL 1 % IJ SOLN
2.0000 mL | INTRAMUSCULAR | Status: AC | PRN
Start: 2023-05-15 — End: 2023-05-15
  Administered 2023-05-15: 2 mL

## 2023-05-15 MED ORDER — METHYLPREDNISOLONE ACETATE 40 MG/ML IJ SUSP
80.0000 mg | INTRAMUSCULAR | Status: AC | PRN
Start: 2023-05-15 — End: 2023-05-15
  Administered 2023-05-15: 80 mg via INTRA_ARTICULAR

## 2023-05-15 MED ORDER — BUPIVACAINE HCL 0.25 % IJ SOLN
2.0000 mL | INTRAMUSCULAR | Status: AC | PRN
Start: 2023-05-15 — End: 2023-05-15
  Administered 2023-05-15: 2 mL via INTRA_ARTICULAR

## 2023-05-15 NOTE — Progress Notes (Signed)
Office Visit Note   Patient: Kristine Madden           Date of Birth: November 05, 1966           MRN: [id number] Visit Date: 05/15/2023              Requested by: Meryl Dare, NP [address] [place],  [place] [address] PCP: Meryl Dare, NP   Assessment & Plan: Visit Diagnoses:  1. Chronic pain of right knee     Plan: Pleasant active 57 year old woman with 72-month history of right medial knee pain.  Denies any big traumatic injury though she was sailing in the [place] and got awkwardly off a boat at 1 point.  No effusion no redness she has good ligamentous stability most pains on the medial joint line.  She does have some grinding with range of motion.  She has similar symptoms on her left knee almost a year ago and got a cortisone injection seem to help her will go forward with this today told her to give it a couple weeks and contact me if it does not improve  Follow-Up Instructions: No follow-ups on file.   Orders:  Orders Placed This Encounter  Procedures  . XR KNEE 3 VIEW RIGHT   No orders of the defined types were placed in this encounter.     Procedures: Large Joint Inj on 05/15/2023 2:14 PM Indications: pain and diagnostic evaluation Details: 25 G 1.5 in needle, anteromedial approach  Arthrogram: No  Medications: 80 mg methylPREDNISolone acetate 40 MG/ML; 2 mL lidocaine 1 %; 2 mL bupivacaine 0.25 % Outcome: tolerated well, no immediate complications Procedure, treatment alternatives, risks and benefits explained, specific risks discussed. Consent was given by the patient.     Clinical Data: No additional findings.   Subjective: Chief Complaint  Patient presents with  . Right Knee - Pain    HPI Patient is a pleasant 57 year old woman with a chief complaint of right medial knee pain x 6 months.  No known injury.  Describes her pain is in the mild to moderate.  She has been using Motrin but still continues to have  pain Review of Systems  All other systems reviewed and are negative.    Objective: Vital Signs: There were no vitals taken for this visit.  Physical Exam Constitutional:      Appearance: Normal appearance.  Pulmonary:     Effort: Pulmonary effort is normal.  Skin:    General: Skin is warm and dry.  Neurological:     General: No focal deficit present.     Mental Status: She is alert and oriented to person, place, and time.    Ortho Exam Right knee no effusion no erythema compartments are soft and nontender she is neurovascular intact no quadricep atrophy.  She does have some grinding anteriorly with range of motion though this is not painful.  Good varus valgus stability strength and is intact distally good endpoint on anterior draw.  Had does have some tenderness over the medial joint line Specialty Comments:  No specialty comments available.  Imaging: No results found.   PMFS History: Patient Active Problem List   Diagnosis Date Noted  . Pain in left knee 07/15/2022  . Breast lump on right side at 12 o'clock position 02/17/2018  . No-show for appointment 11/18/2017  . Acute non-recurrent frontal sinusitis 09/29/2017  . Endometrial adenocarcinoma ([facility]) 06/29/2017  . Postoperative state 06/17/2017  . Encounter for general adult  medical examination with abnormal findings 05/27/2017  . Depression 05/27/2017  . Tobacco use 05/27/2017  . Pleuritic chest pain 09/04/2016  . Chronic hepatitis C without hepatic coma ([facility]) 09/04/2016   Past Medical History:  Diagnosis Date  . Anxiety   . Arthritis   . Cancer [facility]) 2005   islet cell tumor  . Depression   . Hepatitis C   . Hyperlipidemia   . Pericarditis 09/2016    Family History  Problem Relation Age of Onset  . Arthritis Mother   . Hyperlipidemia Mother   . Hypertension Father   . Colon polyps Father   . Colon cancer Neg Hx   . Esophageal cancer Neg Hx   . Rectal cancer Neg Hx   . Stomach cancer Neg Hx      Past Surgical History:  Procedure Laterality Date  . DILATATION & CURETTAGE/HYSTEROSCOPY WITH MYOSURE N/A 01/03/2017   Procedure: DILATATION & CURETTAGE/HYSTEROSCOPY WITH MYOSURE;  Surgeon: Genia Del, MD;  Location: [facility];  Service: Gynecology;  Laterality: N/A;  . EYE SURGERY    . HEMORRHOID SURGERY    . KNEE ARTHROSCOPY    . LAPAROSCOPY N/A 06/28/2017   Procedure: VAGINAL EXAM UNDER ANESTHESIA,LAPAROSCOPY DIAGNOSTIC, EXPLORATORY LAPAROTOMY WITH EVACUATION OF HEMATOMA;  Surgeon: Genia Del, MD;  Location: [facility];  Service: Gynecology;  Laterality: N/A;  . 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 REPAIR VAGINAL CUFF N/A 06/27/2017   Procedure: REPAIR VAGINAL CUFF;  Surgeon: Genia Del, MD;  Location: [facility];  Service: Gynecology;  Laterality: N/A;  . ROBOTIC ASSISTED TOTAL HYSTERECTOMY WITH UTEROSACRAL LIGAMENT SUSPENSION Bilateral 06/17/2017   Procedure: ROBOTIC ASSISTED TOTAL HYSTERECTOMY, Bilateral Salpingectomy,  UTEROSACRAL LIGAMENT SUSPENSION;  Surgeon: Genia Del, MD;  Location: [facility];  Service: Gynecology;  Laterality: Bilateral;     RNFA confirmed on 05/12/17  Dr. Seymour Bars assisting in OR room 3  . SPLENECTOMY, TOTAL    . TONSILLECTOMY    . TUBAL LIGATION     Social History   Occupational History  . Occupation: Programme researcher, broadcasting/film/video  Tobacco Use  . Smoking status: Every Day    Packs/day: 0.50    Years: 30.00    Additional pack years: 0.00    Total pack years: 15.00    Types: Cigarettes  . Smokeless tobacco: Never  Vaping Use  . Vaping Use: Never used  Substance and Sexual Activity  . Alcohol use: Yes    Alcohol/week: 5.0 standard drinks of alcohol    Types: 5 Glasses of wine per week  . Drug use: Not Currently    Types: LSD, Cocaine    Comment: pt denies any use at this time-per pt  . Sexual activity: Yes    Birth control/protection: Post-menopausal

## 2023-09-15 ENCOUNTER — Ambulatory Visit: Payer: Self-pay | Admitting: Physician Assistant

## 2023-09-15 ENCOUNTER — Encounter: Payer: Self-pay | Admitting: Physician Assistant

## 2023-09-15 DIAGNOSIS — M1711 Unilateral primary osteoarthritis, right knee: Secondary | ICD-10-CM

## 2023-09-15 DIAGNOSIS — M25561 Pain in right knee: Secondary | ICD-10-CM

## 2023-09-15 MED ORDER — LIDOCAINE HCL 1 % IJ SOLN
2.0000 mL | INTRAMUSCULAR | Status: AC | PRN
Start: 2023-09-15 — End: 2023-09-15
  Administered 2023-09-15: 2 mL

## 2023-09-15 MED ORDER — BUPIVACAINE HCL 0.25 % IJ SOLN
2.0000 mL | INTRAMUSCULAR | Status: AC | PRN
Start: 2023-09-15 — End: 2023-09-15
  Administered 2023-09-15: 2 mL via INTRA_ARTICULAR

## 2023-09-15 MED ORDER — METHYLPREDNISOLONE ACETATE 40 MG/ML IJ SUSP
80.0000 mg | INTRAMUSCULAR | Status: AC | PRN
Start: 2023-09-15 — End: 2023-09-15
  Administered 2023-09-15: 80 mg via INTRA_ARTICULAR

## 2023-09-15 NOTE — Progress Notes (Signed)
Office Visit Note   Patient: Kristine Madden           Date of Birth: [birth year]           MRN: [id number] Visit Date: 09/15/2023              Requested by: Uchenna Seufert, [place], [place] [address] [place],  [place] [address] PCP: Meryl Dare, NP  Right knee pain    HPI: Santina Evans is a pleasant 57 year old woman with a history of right knee pain.  She does have some degenerative changes in her right knee and actually has done well with injections.  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 Acute pain of right knee   2. Unilateral primary osteoarthritis, right knee     Plan: Will go forward with an injection into her right knee today.  The degenerative changes in her knee are not severely advanced.  She has occasional mechanical symptoms could have a superimposed meniscus tear.  We discussed that as long as the injections are helping probably more due to degenerative changes certainly could qualify for an MRI we will let me know about that.  Would also be a candidate for viscosupplementation she will let me know but otherwise may follow-up as needed  Follow-Up Instructions: Return if symptoms worsen or fail to improve.   Ortho Exam  Patient is alert, oriented, no adenopathy, well-dressed, normal affect, normal respiratory effort. Right knee no effusion no erythema compartments are soft and compressible no warmth.  She is neurovascularly intact negative Denna Haggard' sign  Imaging: No results found. No images are attached to the encounter.  Labs: Lab Results  Component Value Date   REPTSTATUS 04/05/2023 FINAL 04/04/2023   GRAMSTAIN  07/04/2017    MODERATE WBC PRESENT, PREDOMINANTLY PMN ABUNDANT GRAM NEGATIVE COCCI IN PAIRS MODERATE GRAM NEGATIVE RODS ABUNDANT GRAM POSITIVE COCCI IN PAIRS IN CLUSTERS FEW GRAM POSITIVE RODS    CULT (A) 04/04/2023    <10,000 COLONIES/mL  INSIGNIFICANT GROWTH Performed at [facility], [address] [place], [place] [address]    LABORGA SALMONELLA SPECIES (A) 03/22/2023     Lab Results  Component Value Date   ALBUMIN 4.6 05/29/2018   ALBUMIN 2.4 (L) 07/06/2017   ALBUMIN 2.2 (L) 07/05/2017    No results found for: "MG" No results found for: "VD25OH"  No results found for: "PREALBUMIN"    Latest Ref Rng & Units 05/29/2018   11:29 AM 07/17/2017   10:17 AM 07/06/2017    5:02 AM  CBC EXTENDED  WBC 4.0 - 10.5 K/uL 6.2  7.4  11.4   RBC 3.87 - 5.11 Mil/uL 5.00  3.46  2.95   Hemoglobin 12.0 - 15.0 g/dL 78.4  69.6  9.2   HCT 29.5 - 46.0 % 47.0  32.5  26.4   Platelets 150.0 - 400.0 K/uL 265.0  592  724   NEUT# 1,500 - 7,800 cells/uL  2,886  6.0   Lymph# 850 - 3,900 cells/uL  2,516  2.3      There is no height or weight on file to calculate BMI.  Orders:  No orders of the defined types were placed in this encounter.  No orders of the defined types were placed in this encounter.    Procedures: Large Joint Inj: R knee on 09/15/2023 10:16 AM Indications: pain and diagnostic evaluation  Details: 25 G 1.5 in needle, anteromedial approach  Arthrogram: No  Medications: 80 mg methylPREDNISolone acetate 40 MG/ML; 2 mL lidocaine 1 %; 2 mL bupivacaine 0.25 % Outcome: tolerated well, no immediate complications Procedure, treatment alternatives, risks and benefits explained, specific risks discussed. Consent was given by the patient.    Clinical Data: No additional findings.  ROS:  All other systems negative, except as noted in the HPI. Review of Systems  Objective: Vital Signs: There were no vitals taken for this visit.  Specialty Comments:  No specialty comments available.  PMFS History: Patient Active Problem List   Diagnosis Date Noted   Pain in right knee 09/15/2023   Unilateral primary osteoarthritis, right knee 09/15/2023   Pain in left knee 07/15/2022   Breast lump on right side at 12 o'clock  position 02/17/2018   No-show for appointment 11/18/2017   Acute non-recurrent frontal sinusitis 09/29/2017   Endometrial adenocarcinoma (HCC) 06/29/2017   Postoperative state 06/17/2017   Encounter for general adult medical examination with abnormal findings 05/27/2017   Depression 05/27/2017   Tobacco use 05/27/2017   Pleuritic chest pain 09/04/2016   Chronic hepatitis C without hepatic coma (HCC) 09/04/2016   Past Medical History:  Diagnosis Date   Anxiety    Arthritis    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   Depression    Hepatitis C    Hyperlipidemia    Pericarditis 09/2016    Family History  Problem Relation Age of Onset   Arthritis Mother    Hyperlipidemia Mother    Hypertension Father    Colon polyps Father    Colon cancer Neg Hx    Esophageal cancer Neg Hx    Rectal cancer Neg Hx    Stomach cancer Neg Hx     Past Surgical History:  Procedure Laterality Date   DILATATION & CURETTAGE/HYSTEROSCOPY WITH MYOSURE N/A 01/03/2017   Procedure: DILATATION & CURETTAGE/HYSTEROSCOPY WITH MYOSURE;  Surgeon: Genia Del, MD;  Location: [facility];  Service: Gynecology;  Laterality: N/A;   EYE SURGERY     HEMORRHOID SURGERY     KNEE ARTHROSCOPY     LAPAROSCOPY N/A 06/28/2017   Procedure: VAGINAL EXAM UNDER ANESTHESIA,LAPAROSCOPY DIAGNOSTIC, EXPLORATORY LAPAROTOMY WITH EVACUATION OF HEMATOMA;  Surgeon: Genia Del, MD;  Location: [facility];  Service: Gynecology;  Laterality: N/A;   PANCREATECTOMY     1/2 pancreas removed   REPAIR VAGINAL CUFF N/A 06/27/2017   Procedure: REPAIR VAGINAL CUFF;  Surgeon: Genia Del, MD;  Location: [facility];  Service: Gynecology;  Laterality: N/A;   ROBOTIC ASSISTED TOTAL HYSTERECTOMY WITH UTEROSACRAL LIGAMENT SUSPENSION Bilateral 06/17/2017   Procedure: ROBOTIC ASSISTED TOTAL HYSTERECTOMY, Bilateral Salpingectomy,  UTEROSACRAL LIGAMENT SUSPENSION;  Surgeon: Genia Del, MD;  Location: [facility];  Service: Gynecology;  Laterality:  Bilateral;     RNFA confirmed on 05/12/17  Dr. Seymour Bars assisting in OR room 3   SPLENECTOMY, TOTAL     TONSILLECTOMY     TUBAL LIGATION     Social History   Occupational History   Occupation: Programme researcher, broadcasting/film/video  Tobacco Use   Smoking status: Every Day    Current packs/day: 0.50    Average packs/day: 0.5 packs/day for 30.0 years (15.0 ttl pk-yrs)    Types: Cigarettes   Smokeless tobacco: Never  Vaping Use   Vaping status: Never Used  Substance and Sexual Activity   Alcohol use: Yes    Alcohol/week: 5.0 standard drinks of alcohol    Types: 5 Glasses of wine  per week   Drug use: Not Currently    Types: LSD, Cocaine    Comment: pt denies any use at this time-per pt   Sexual activity: Yes    Birth control/protection: Post-menopausal

## 2024-05-31 ENCOUNTER — Encounter: Payer: Self-pay | Admitting: Physician Assistant

## 2024-05-31 ENCOUNTER — Ambulatory Visit (INDEPENDENT_AMBULATORY_CARE_PROVIDER_SITE_OTHER): Payer: Self-pay | Admitting: Physician Assistant

## 2024-05-31 DIAGNOSIS — M1711 Unilateral primary osteoarthritis, right knee: Secondary | ICD-10-CM

## 2024-05-31 MED ORDER — LIDOCAINE HCL 1 % IJ SOLN
4.0000 mL | INTRAMUSCULAR | Status: AC | PRN
Start: 2024-05-31 — End: 2024-05-31
  Administered 2024-05-31: 4 mL

## 2024-05-31 MED ORDER — METHYLPREDNISOLONE ACETATE 40 MG/ML IJ SUSP
40.0000 mg | INTRAMUSCULAR | Status: AC | PRN
Start: 2024-05-31 — End: 2024-05-31
  Administered 2024-05-31: 40 mg via INTRA_ARTICULAR

## 2024-05-31 NOTE — Progress Notes (Signed)
 Office Visit Note   Patient: Kristine Madden           Date of Birth: 1966/02/23           MRN: [id number] Visit Date: 05/31/2024              Requested by: Refugio Cantor, NP [address] [place],  [place] [address] PCP: Refugio Cantor, NP  Osteoarthritis right knee    HPI: Patient comes in today requesting an injection of steroid into her right knee.  Last injection was September last year she does get good relief.  No new injury  Assessment & Plan: Visit Diagnoses: Right knee pain  Plan: Will go forward with an injection today did discuss the possibility of getting an MRI if she continues to have symptoms  Follow-Up Instructions: Return if symptoms worsen or fail to improve.   Ortho Exam  Patient is alert, oriented, no adenopathy, well-dressed, normal affect, normal respiratory effort. Right knee she is neurovascular intact no redness no effusion no erythema compartments are soft and nontender negative Homans' sign    Imaging: No results found. No images are attached to the encounter.  Labs: Lab Results  Component Value Date   REPTSTATUS 04/05/2023 FINAL 04/04/2023   GRAMSTAIN  07/04/2017    MODERATE WBC PRESENT, PREDOMINANTLY PMN ABUNDANT GRAM NEGATIVE COCCI IN PAIRS MODERATE GRAM NEGATIVE RODS ABUNDANT GRAM POSITIVE COCCI IN PAIRS IN CLUSTERS FEW GRAM POSITIVE RODS    CULT (A) 04/04/2023    <10,000 COLONIES/mL INSIGNIFICANT GROWTH Performed at [facility], [address], [place], [place] [address]    LABORGA SALMONELLA SPECIES (A) 03/22/2023     Lab Results  Component Value Date   ALBUMIN 4.6 05/29/2018   ALBUMIN 2.4 (L) 07/06/2017   ALBUMIN 2.2 (L) 07/05/2017    No results found for: "MG" No results found for: "VD25OH"  No results found for: "PREALBUMIN"    Latest Ref Rng & Units 05/29/2018   11:29 AM 07/17/2017   10:17 AM 07/06/2017    5:02 AM  CBC EXTENDED  WBC 4.0 - 10.5 K/uL 6.2  7.4  11.4   RBC 3.87 -  5.11 Mil/uL 5.00  3.46  2.95   Hemoglobin 12.0 - 15.0 g/dL 52.8  41.3  9.2   HCT 24.4 - 46.0 % 47.0  32.5  26.4   Platelets 150.0 - 400.0 K/uL 265.0  592  724   NEUT# 1,500 - 7,800 cells/uL  2,886  6.0   Lymph# 850 - 3,900 cells/uL  2,516  2.3      There is no height or weight on file to calculate BMI.  Orders:  No orders of the defined types were placed in this encounter.  No orders of the defined types were placed in this encounter.    Procedures: Large Joint Inj: R knee on 05/31/2024 4:02 PM Indications: pain and diagnostic evaluation Details: 25 G 1.5 in needle, anteromedial approach  Arthrogram: No  Medications: 40 mg methylPREDNISolone  acetate 40 MG/ML; 4 mL lidocaine  1 % Outcome: tolerated well, no immediate complications Procedure, treatment alternatives, risks and benefits explained, specific risks discussed. Consent was given by the patient.    Clinical Data: No additional findings.  ROS:  All other systems negative, except as noted in the HPI. Review of Systems  Objective: Vital Signs: There were no vitals taken for this visit.  Specialty Comments:  No specialty comments available.  PMFS History: Patient Active Problem List   Diagnosis  Date Noted  . Pain in right knee 09/15/2023  . Unilateral primary osteoarthritis, right knee 09/15/2023  . Pain in left knee 07/15/2022  . Breast lump on right side at 12 o'clock position 02/17/2018  . No-show for appointment 11/18/2017  . Acute non-recurrent frontal sinusitis 09/29/2017  . Endometrial adenocarcinoma (HCC) 06/29/2017  . Postoperative state 06/17/2017  . Encounter for general adult medical examination with abnormal findings 05/27/2017  . Depression 05/27/2017  . Tobacco use 05/27/2017  . Pleuritic chest pain 09/04/2016  . Chronic hepatitis C without hepatic coma (HCC) 09/04/2016   Past Medical History:  Diagnosis Date  . Anxiety   . Arthritis   . Cancer [facility]) 2005   islet cell tumor  .  Depression   . Hepatitis C   . Hyperlipidemia   . Pericarditis 09/2016    Family History  Problem Relation Age of Onset  . Arthritis Mother   . Hyperlipidemia Mother   . Hypertension Father   . Colon polyps Father   . Colon cancer Neg Hx   . Esophageal cancer Neg Hx   . Rectal cancer Neg Hx   . Stomach cancer Neg Hx     Past Surgical History:  Procedure Laterality Date  . DILATATION & CURETTAGE/HYSTEROSCOPY WITH MYOSURE N/A 01/03/2017   Procedure: DILATATION & CURETTAGE/HYSTEROSCOPY WITH MYOSURE;  Surgeon: Percy Bracken, MD;  Location: [facility];  Service: Gynecology;  Laterality: N/A;  . EYE SURGERY    . HEMORRHOID SURGERY    . KNEE ARTHROSCOPY    . LAPAROSCOPY N/A 06/28/2017   Procedure: VAGINAL EXAM UNDER ANESTHESIA,LAPAROSCOPY DIAGNOSTIC, EXPLORATORY LAPAROTOMY WITH EVACUATION OF HEMATOMA;  Surgeon: Lavoie, Marie-Lyne, MD;  Location: [facility];  Service: Gynecology;  Laterality: N/A;  . 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 REPAIR VAGINAL CUFF N/A 06/27/2017   Procedure: REPAIR VAGINAL CUFF;  Surgeon: Lavoie, Marie-Lyne, MD;  Location: [facility];  Service: Gynecology;  Laterality: N/A;  . ROBOTIC ASSISTED TOTAL HYSTERECTOMY WITH UTEROSACRAL LIGAMENT SUSPENSION Bilateral 06/17/2017   Procedure: ROBOTIC ASSISTED TOTAL HYSTERECTOMY, Bilateral Salpingectomy,  UTEROSACRAL LIGAMENT SUSPENSION;  Surgeon: Lavoie, Marie-Lyne, MD;  Location: [facility];  Service: Gynecology;  Laterality: Bilateral;     RNFA confirmed on 05/12/17  Dr. Lavoie assisting in OR room 3  . SPLENECTOMY, TOTAL    . TONSILLECTOMY    . TUBAL LIGATION     Social History   Occupational History  . Occupation: Programme researcher, broadcasting/film/video  Tobacco Use  . Smoking status: Every Day    Current packs/day: 0.50    Average packs/day: 0.5 packs/day for 30.0 years (15.0 ttl pk-yrs)    Types: Cigarettes  . Smokeless tobacco: Never  Vaping Use  . Vaping status: Never Used  Substance and Sexual Activity  . Alcohol use: Yes     Alcohol/week: 5.0 standard drinks of alcohol    Types: 5 Glasses of wine per week  . Drug use: Not Currently    Types: LSD, Cocaine    Comment: pt denies any use at this time-per pt  . Sexual activity: Yes    Birth control/protection: Post-menopausal

## 2024-07-05 ENCOUNTER — Telehealth: Payer: Self-pay | Admitting: Physician Assistant

## 2024-07-05 NOTE — Telephone Encounter (Signed)
 Pt called asking for referral for MRI sent to [facility] address [address]. Pt is self pay pt so will be paying out of pocket for MRI. Pt made an appt with Ronal Caldron 7.22 at [facility]. Please send referral. Pt knows that MRI will call her with date and further instructions. Pt still want to see Ronal Caldron about knee and discuss if MRI can be review over the phone after date of MRI. Pt phone number is [phone].

## 2024-07-13 ENCOUNTER — Ambulatory Visit (HOSPITAL_BASED_OUTPATIENT_CLINIC_OR_DEPARTMENT_OTHER): Payer: Self-pay | Admitting: Physician Assistant

## 2024-10-25 ENCOUNTER — Encounter: Payer: Self-pay | Admitting: Radiology

## 2025-02-03 ENCOUNTER — Ambulatory Visit: Payer: Self-pay
